# Patient Record
Sex: Female | Born: 1970 | Race: White | Hispanic: No | Marital: Married | Smoking: Never smoker
Health system: Southern US, Community
[De-identification: ages and names within clinical notes are randomized; demographics above are authoritative.]

## PROBLEM LIST (undated history)

## (undated) DIAGNOSIS — Z973 Presence of spectacles and contact lenses: Secondary | ICD-10-CM

## (undated) DIAGNOSIS — K219 Gastro-esophageal reflux disease without esophagitis: Secondary | ICD-10-CM

## (undated) DIAGNOSIS — T753XXA Motion sickness, initial encounter: Secondary | ICD-10-CM

## (undated) HISTORY — PX: FOOT SURGERY: SHX648

## (undated) HISTORY — DX: Gastro-esophageal reflux disease without esophagitis: K21.9

## (undated) HISTORY — PX: ECTOPIC PREGNANCY SURGERY: SHX613

## (undated) HISTORY — PX: DILATION AND CURETTAGE OF UTERUS: SHX78

---

## 2006-02-14 ENCOUNTER — Ambulatory Visit: Payer: Self-pay

## 2006-11-29 ENCOUNTER — Ambulatory Visit: Payer: Self-pay | Admitting: Obstetrics and Gynecology

## 2006-12-31 ENCOUNTER — Ambulatory Visit: Payer: Self-pay | Admitting: Obstetrics and Gynecology

## 2007-08-05 ENCOUNTER — Ambulatory Visit: Payer: Self-pay | Admitting: Obstetrics and Gynecology

## 2011-11-26 ENCOUNTER — Ambulatory Visit: Payer: Self-pay | Admitting: Obstetrics and Gynecology

## 2012-07-09 ENCOUNTER — Ambulatory Visit: Payer: Self-pay | Admitting: Internal Medicine

## 2012-11-26 ENCOUNTER — Ambulatory Visit: Payer: Self-pay | Admitting: Obstetrics and Gynecology

## 2014-07-09 ENCOUNTER — Ambulatory Visit: Payer: Self-pay | Admitting: Family Medicine

## 2015-09-21 ENCOUNTER — Other Ambulatory Visit: Payer: Self-pay | Admitting: Family Medicine

## 2015-10-18 ENCOUNTER — Telehealth: Payer: Self-pay

## 2015-10-18 DIAGNOSIS — N63 Unspecified lump in unspecified breast: Secondary | ICD-10-CM

## 2015-10-18 DIAGNOSIS — Z1239 Encounter for other screening for malignant neoplasm of breast: Secondary | ICD-10-CM

## 2015-10-18 NOTE — Telephone Encounter (Signed)
Patient called and states that she needs her mammogram scheduled for screening, her last one was 07/09/14 and US done at that time she had cyst. [place] told patient to call us to get this order since it was abnormal last time. Please review and let pt know when this is done please-aa

## 2015-10-19 DIAGNOSIS — M79673 Pain in unspecified foot: Secondary | ICD-10-CM | POA: Insufficient documentation

## 2015-10-19 DIAGNOSIS — Z8709 Personal history of other diseases of the respiratory system: Secondary | ICD-10-CM | POA: Insufficient documentation

## 2015-10-19 DIAGNOSIS — L255 Unspecified contact dermatitis due to plants, except food: Secondary | ICD-10-CM | POA: Insufficient documentation

## 2015-10-19 DIAGNOSIS — Z8719 Personal history of other diseases of the digestive system: Secondary | ICD-10-CM | POA: Insufficient documentation

## 2015-10-19 DIAGNOSIS — N63 Unspecified lump in unspecified breast: Secondary | ICD-10-CM | POA: Insufficient documentation

## 2015-10-19 NOTE — Telephone Encounter (Signed)
Would think diagnostic. Thanks.

## 2015-10-19 NOTE — Telephone Encounter (Signed)
Dr. Elease HashimotoMaloney should I order a screening or diagnostic mammogram? Please advise. sd

## 2015-10-19 NOTE — Telephone Encounter (Signed)
Ok to order and notify patient. Thanks.

## 2015-10-20 ENCOUNTER — Telehealth: Payer: Self-pay | Admitting: Family Medicine

## 2015-10-20 NOTE — Telephone Encounter (Signed)
Apt 11/01/2015 2pm; pt advised.   Thanks,   -Vernona RiegerLaura

## 2015-10-20 NOTE — Telephone Encounter (Signed)
Pt stating she would like for Dr. Elease HashimotoMaloney to write her an order for her mammogram. CB# [phone]

## 2015-11-01 ENCOUNTER — Ambulatory Visit
Admission: RE | Admit: 2015-11-01 | Discharge: 2015-11-01 | Disposition: A | Discharge status: Home / Self Care | Payer: PRIVATE HEALTH INSURANCE | Source: Ambulatory Visit | Attending: Family Medicine | Admitting: Family Medicine

## 2015-11-01 DIAGNOSIS — N63 Unspecified lump in unspecified breast: Secondary | ICD-10-CM

## 2015-11-01 DIAGNOSIS — Z1239 Encounter for other screening for malignant neoplasm of breast: Secondary | ICD-10-CM

## 2015-11-01 DIAGNOSIS — Z1231 Encounter for screening mammogram for malignant neoplasm of breast: Secondary | ICD-10-CM | POA: Insufficient documentation

## 2016-02-17 ENCOUNTER — Ambulatory Visit (INDEPENDENT_AMBULATORY_CARE_PROVIDER_SITE_OTHER): Payer: PRIVATE HEALTH INSURANCE | Admitting: Physician Assistant

## 2016-02-17 ENCOUNTER — Encounter: Payer: Self-pay | Admitting: Physician Assistant

## 2016-02-17 ENCOUNTER — Telehealth: Payer: Self-pay | Admitting: Physician Assistant

## 2016-02-17 VITALS — BP 140/70 | HR 81 | Temp 98.2°F | Resp 16 | Wt 169.2 lb

## 2016-02-17 DIAGNOSIS — N63 Unspecified lump in unspecified breast: Secondary | ICD-10-CM

## 2016-02-17 NOTE — Patient Instructions (Signed)
Breast Cyst A breast cyst is a sac in the breast that is filled with fluid. Breast cysts are common in women. Women can have one or many cysts. When the breasts contain many cysts, it is usually due to a noncancerous (benign) condition called fibrocystic change. These lumps form under the influence of female hormones (estrogen and progesterone). The lumps are most often located in the upper, outer portion of the breast. They are often more swollen, painful, and tender before your period starts. They usually disappear after menopause, unless you are on hormone therapy.  There are several types of cysts:  Macrocyst. This is a cyst that is about 2 in. (5.1 cm) in diameter.   Microcyst. This is a tiny cyst that you cannot feel but can be seen with a mammogram or an ultrasound.   Galactocele. This is a cyst containing milk that may develop if you suddenly stop breastfeeding.   Sebaceous cyst of the skin. This type of cyst is not in the breast tissue itself. Breast cysts do not increase your risk of breast cancer. However, they must be monitored closely because they can be cancerous.  CAUSES  It is not known exactly what causes a breast cyst to form. Possible causes include:  An overgrowth of milk glands and connective tissue in the breast can block the milk glands, causing them to fill with fluid.   Scar tissue in the breast from previous surgery may block the glands, causing a cyst.  RISK FACTORS Estrogen may influence the development of a breast cyst.  SIGNS AND SYMPTOMS   Feeling a smooth, round, soft lump (like a grape) in the breast that is easily moveable.   Breast discomfort or pain.  Increase in size of the lump before your menstrual period and decrease in its size after your menstrual period.  DIAGNOSIS  A cyst can be felt during a physical exam by your health care provider. A breast X-ray exam (mammogram) and ultrasonography will be done to confirm the diagnosis. Fluid may  be removed from the cyst with a needle (fine needle aspiration) to make sure the cyst is not cancerous.  TREATMENT  Treatment may not be necessary. Your health care provider may monitor the cyst to see if it goes away on its own. If treatment is needed, it may include:  Hormone treatment.   Needle aspiration. There is a chance of the cyst coming back after aspiration.   Surgery to remove the whole cyst.  HOME CARE INSTRUCTIONS   Keep all follow-up appointments with your health care provider.  See your health care provider regularly:  Get a yearly exam by your health care provider.  Have a clinical breast exam by a health care provider every 1-3 years if you are 20-40 years of age. After age 40 years, you should have the exam every year.   Get mammogram tests as directed by your health care provider.   Understand the normal appearance and feel of your breasts and perform breast self-exams.   Only take over-the-counter or prescription medicines as directed by your health care provider.   Wear a supportive bra, especially when exercising.   Avoid caffeine.   Reduce your salt intake, especially before your menstrual period. Too much salt can cause fluid retention, breast swelling, and discomfort.  SEEK MEDICAL CARE IF:   You feel, or think you feel, a lump in your breast.   You notice that both breasts look or feel different than usual.   Your   breast is still causing pain after your menstrual period is over.   You need medicine for breast pain and swelling that occurs with your menstrual period.  SEEK IMMEDIATE MEDICAL CARE IF:   You have severe pain, tenderness, redness, or warmth in your breast.   You have nipple discharge or bleeding.   Your breast lump becomes hard and painful.   You find new lumps or bumps that were not there before.   You feel lumps in your armpit (axilla).   You notice dimpling or wrinkling of the breast or nipple.   You  have a fever.  MAKE SURE YOU:  Understand these instructions.  Will watch your condition.  Will get help right away if you are not doing well or get worse.   This information is not intended to replace advice given to you by your health care provider. Make sure you discuss any questions you have with your health care provider.   Document Released: 12/10/2005 Document Revised: 08/12/2013 Document Reviewed: 07/09/2013 Elsevier Interactive Patient Education 2016 Elsevier Inc.  

## 2016-02-17 NOTE — Telephone Encounter (Signed)
Per [facility] pt is not due for mammogram until Nov.Order needs to be for diagnostic uni mammogram and ultrasound of the breast with the mass and should include clock position

## 2016-02-17 NOTE — Progress Notes (Signed)
       Patient: [name] [name] Female    DOB: 1971/09/11   45 y.o.   MRN: [id number] Visit Date: 02/17/2016  Today's Provider: Margaretann Loveless, PA-C   Chief Complaint  Patient presents with  . Lump on breast   Subjective:    HPI  [name] [name] ia here today with c/o of lump on her right breast. She noticed this past Saturday and it feels sore. She has come in the past for the same lump and was told it was cyst, but this time it feels different and is sore.  All movement of right arm causes pain in the area. No redness, no nipple drainage, no fevers, chills, nausea or vomiting.     Allergies  Allergen Reactions  . Sulfa Antibiotics Rash   Previous Medications   CYANOCOBALAMIN (VITAMIN B 12 PO)    Take by mouth.   IUD'S IU    by Intrauterine route.   MULTIPLE VITAMIN PO    Take by mouth.    Review of Systems  Constitutional: Negative.   HENT: Negative.   Eyes: Negative.   Respiratory: Negative.   Cardiovascular: Negative.   Gastrointestinal: Negative.   Neurological: Negative.     Social History  Substance Use Topics  . Smoking status: Never Smoker   . Smokeless tobacco: Not on file  . Alcohol Use: No   Objective:   BP 140/70 mmHg  Pulse 81  Temp(Src) 98.2 F (36.8 C) (Oral)  Resp 16  Wt 169 lb 3.2 oz (76.749 kg)  LMP 12/21/2015  Physical Exam  Constitutional: She appears well-developed and well-nourished. No distress.  Neck: Normal range of motion. Neck supple. No tracheal deviation present.  Cardiovascular: Normal rate, regular rhythm and normal heart sounds.  Exam reveals no gallop and no friction rub.   No murmur heard. Pulmonary/Chest: Effort normal and breath sounds normal. No respiratory distress. She has no wheezes. She has no rales. Right breast exhibits mass and tenderness. Right breast exhibits no inverted nipple, no nipple discharge and no skin change. Left breast exhibits no inverted nipple, no mass, no nipple discharge, no skin change and no  tenderness. Breasts are symmetrical.    Lymphadenopathy:    She has no cervical adenopathy.  Skin: She is not diaphoretic.  Vitals reviewed.       Assessment & Plan:     1. Breast lump Will obtain imaging as below. I will f/u pending these results. She does have history of cyst bilaterally that have started developing since she had her IUD placed. This one feels different to the patient but is in the same area as previously documented cyst.  - US BREAST COMPLETE UNI RIGHT INC AXILLA; Future - MM Digital Diagnostic Unilat R; Future '      Margaretann Loveless, PA-C  [facility]

## 2016-02-17 NOTE — Telephone Encounter (Signed)
Changes made to orders.

## 2016-02-20 ENCOUNTER — Ambulatory Visit
Admission: RE | Admit: 2016-02-20 | Discharge: 2016-02-20 | Disposition: A | Discharge status: Home / Self Care | Payer: PRIVATE HEALTH INSURANCE | Source: Ambulatory Visit | Attending: Physician Assistant | Admitting: Physician Assistant

## 2016-02-20 ENCOUNTER — Other Ambulatory Visit: Payer: Self-pay | Admitting: Physician Assistant

## 2016-02-20 DIAGNOSIS — N6001 Solitary cyst of right breast: Secondary | ICD-10-CM | POA: Diagnosis not present

## 2016-02-20 DIAGNOSIS — N63 Unspecified lump in unspecified breast: Secondary | ICD-10-CM

## 2016-02-20 NOTE — Telephone Encounter (Signed)
-----   Message from Margaretann Loveless, [place] sent at 02/20/2016  2:54 PM EST ----- Two adjacent cyst seen on mammogram and [place].  Recommend routine mammogram screening starting back in July at scheduled mammogram.

## 2016-02-20 NOTE — Telephone Encounter (Signed)
Left message to call back  

## 2016-02-22 NOTE — Telephone Encounter (Signed)
Patient advised as directed below.  Thanks,  -Mahin Guardia 

## 2016-02-27 ENCOUNTER — Telehealth: Payer: Self-pay | Admitting: Family Medicine

## 2016-02-27 NOTE — Telephone Encounter (Signed)
Is not cream that can help. Usually only a one time thing but is possible to get it again. Ok to return to work if not around people who are pregnant or immunocompromised. Otherwise can return when scab over.  Thanks.

## 2016-02-27 NOTE — Telephone Encounter (Signed)
Patient advised as below, and verbalizes understanding.

## 2016-02-27 NOTE — Telephone Encounter (Signed)
Patient reports that she was seen at [facility] on 02/26/16 due to rash on her right leg. Patient diagnosed with shingles. Patient reports that she was prescribed Acyclovir 800 mg 5 times a day for 7 days. Patient would like to know if there is an OTC cream that she can use and how long does need to stay out of work. Patient reports that she is under a lot of stress at work and reports that she is aware that it may have caused her to have shingles. Patient is wanting to know if this will be a problem for her from now on. sd

## 2016-02-27 NOTE — Telephone Encounter (Signed)
Pt stated she went to walk in clinic Sunday 02/26/16 and was told she has shingles. Pt would like a nurse to return her call. Pt wanted us to document that she has shingles in her chart and also has a few questions about shingle. Thanks TNP

## 2016-09-26 ENCOUNTER — Ambulatory Visit (INDEPENDENT_AMBULATORY_CARE_PROVIDER_SITE_OTHER): Payer: Managed Care, Other (non HMO) | Admitting: Physician Assistant

## 2016-09-26 ENCOUNTER — Encounter: Payer: Self-pay | Admitting: Physician Assistant

## 2016-09-26 VITALS — BP 122/76 | HR 58 | Temp 98.1°F | Resp 14 | Wt 171.2 lb

## 2016-09-26 DIAGNOSIS — K449 Diaphragmatic hernia without obstruction or gangrene: Secondary | ICD-10-CM

## 2016-09-26 DIAGNOSIS — K219 Gastro-esophageal reflux disease without esophagitis: Secondary | ICD-10-CM

## 2016-09-26 MED ORDER — PANTOPRAZOLE SODIUM 20 MG PO TBEC: 20.0000 mg | DELAYED_RELEASE_TABLET | Freq: Every day | ORAL | 1 refills | Status: DC

## 2016-09-26 NOTE — Patient Instructions (Signed)
Gastroesophageal Reflux Disease, Adult Normally, food travels down the esophagus and stays in the stomach to be digested. However, when a person has gastroesophageal reflux disease (GERD), food and stomach acid move back up into the esophagus. When this happens, the esophagus becomes sore and inflamed. Over time, GERD can create small holes (ulcers) in the lining of the esophagus.  CAUSES This condition is caused by a problem with the muscle between the esophagus and the stomach (lower esophageal sphincter, or LES). Normally, the LES muscle closes after food passes through the esophagus to the stomach. When the LES is weakened or abnormal, it does not close properly, and that allows food and stomach acid to go back up into the esophagus. The LES can be weakened by certain dietary substances, medicines, and medical conditions, including:  Tobacco use.  Pregnancy.  Having a hiatal hernia.  Heavy alcohol use.  Certain foods and beverages, such as coffee, chocolate, onions, and peppermint. RISK FACTORS This condition is more likely to develop in:  People who have an increased body weight.  People who have connective tissue disorders.  People who use NSAID medicines. SYMPTOMS Symptoms of this condition include:  Heartburn.  Difficult or painful swallowing.  The feeling of having a lump in the throat.  Abitter taste in the mouth.  Bad breath.  Having a large amount of saliva.  Having an upset or bloated stomach.  Belching.  Chest pain.  Shortness of breath or wheezing.  Ongoing (chronic) cough or a night-time cough.  Wearing away of tooth enamel.  Weight loss. Different conditions can cause chest pain. Make sure to see your health care provider if you experience chest pain. DIAGNOSIS Your health care provider will take a medical history and perform a physical exam. To determine if you have mild or severe GERD, your health care provider may also monitor how you respond  to treatment. You may also have other tests, including:  An endoscopy toexamine your stomach and esophagus with a small camera.  A test thatmeasures the acidity level in your esophagus.  A test thatmeasures how much pressure is on your esophagus.  A barium swallow or modified barium swallow to show the shape, size, and functioning of your esophagus. TREATMENT The goal of treatment is to help relieve your symptoms and to prevent complications. Treatment for this condition may vary depending on how severe your symptoms are. Your health care provider may recommend:  Changes to your diet.  Medicine.  Surgery. HOME CARE INSTRUCTIONS Diet  Follow a diet as recommended by your health care provider. This may involve avoiding foods and drinks such as:  Coffee and tea (with or without caffeine).  Drinks that containalcohol.  Energy drinks and sports drinks.  Carbonated drinks or sodas.  Chocolate and cocoa.  Peppermint and mint flavorings.  Garlic and onions.  Horseradish.  Spicy and acidic foods, including peppers, chili powder, curry powder, vinegar, hot sauces, and barbecue sauce.  Citrus fruit juices and citrus fruits, such as oranges, lemons, and limes.  Tomato-based foods, such as red sauce, chili, salsa, and pizza with red sauce.  Fried and fatty foods, such as donuts, french fries, potato chips, and high-fat dressings.  High-fat meats, such as hot dogs and fatty cuts of red and white meats, such as rib eye steak, sausage, ham, and bacon.  High-fat dairy items, such as whole milk, butter, and cream cheese.  Eat small, frequent meals instead of large meals.  Avoid drinking large amounts of liquid with your   meals.  Avoid eating meals during the 2-3 hours before bedtime.  Avoid lying down right after you eat.  Do not exercise right after you eat. General Instructions  Pay attention to any changes in your symptoms.  Take over-the-counter and prescription  medicines only as told by your health care provider. Do not take aspirin, ibuprofen, or other NSAIDs unless your health care provider told you to do so.  Do not use any tobacco products, including cigarettes, chewing tobacco, and e-cigarettes. If you need help quitting, ask your health care provider.  Wear loose-fitting clothing. Do not wear anything tight around your waist that causes pressure on your abdomen.  Raise (elevate) the head of your bed 6 inches (15cm).  Try to reduce your stress, such as with yoga or meditation. If you need help reducing stress, ask your health care provider.  If you are overweight, reduce your weight to an amount that is healthy for you. Ask your health care provider for guidance about a safe weight loss goal.  Keep all follow-up visits as told by your health care provider. This is important. SEEK MEDICAL CARE IF:  You have new symptoms.  You have unexplained weight loss.  You have difficulty swallowing, or it hurts to swallow.  You have wheezing or a persistent cough.  Your symptoms do not improve with treatment.  You have a hoarse voice. SEEK IMMEDIATE MEDICAL CARE IF:  You have pain in your arms, neck, jaw, teeth, or back.  You feel sweaty, dizzy, or light-headed.  You have chest pain or shortness of breath.  You vomit and your vomit looks like blood or coffee grounds.  You faint.  Your stool is bloody or black.  You cannot swallow, drink, or eat.   This information is not intended to replace advice given to you by your health care provider. Make sure you discuss any questions you have with your health care provider.   Document Released: 09/19/2005 Document Revised: 08/31/2015 Document Reviewed: 04/06/2015 Elsevier Interactive Patient Education 2016 Elsevier Inc.  Food Choices for Gastroesophageal Reflux Disease, Adult When you have gastroesophageal reflux disease (GERD), the foods you eat and your eating habits are very important.  Choosing the right foods can help ease the discomfort of GERD. WHAT GENERAL GUIDELINES DO I NEED TO FOLLOW?  Choose fruits, vegetables, whole grains, low-fat dairy products, and low-fat meat, fish, and poultry.  Limit fats such as oils, salad dressings, butter, nuts, and avocado.  Keep a food diary to identify foods that cause symptoms.  Avoid foods that cause reflux. These may be different for different people.  Eat frequent small meals instead of three large meals each day.  Eat your meals slowly, in a relaxed setting.  Limit fried foods.  Cook foods using methods other than frying.  Avoid drinking alcohol.  Avoid drinking large amounts of liquids with your meals.  Avoid bending over or lying down until 2-3 hours after eating. WHAT FOODS ARE NOT RECOMMENDED? The following are some foods and drinks that may worsen your symptoms: Vegetables Tomatoes. Tomato juice. Tomato and spaghetti sauce. Chili peppers. Onion and garlic. Horseradish. Fruits Oranges, grapefruit, and lemon (fruit and juice). Meats High-fat meats, fish, and poultry. This includes hot dogs, ribs, ham, sausage, salami, and bacon. Dairy Whole milk and chocolate milk. Sour cream. Cream. Butter. Ice cream. Cream cheese.  Beverages Coffee and tea, with or without caffeine. Carbonated beverages or energy drinks. Condiments Hot sauce. Barbecue sauce.  Sweets/Desserts Chocolate and cocoa. Donuts. Peppermint and   spearmint. Fats and Oils High-fat foods, including French fries and potato chips. Other Vinegar. Strong spices, such as black pepper, white pepper, red pepper, cayenne, curry powder, cloves, ginger, and chili powder. The items listed above may not be a complete list of foods and beverages to avoid. Contact your dietitian for more information.   This information is not intended to replace advice given to you by your health care provider. Make sure you discuss any questions you have with your health care  provider.   Document Released: 12/10/2005 Document Revised: 12/31/2014 Document Reviewed: 10/14/2013 Elsevier Interactive Patient Education 2016 Elsevier Inc.  Pantoprazole tablets What is this medicine? PANTOPRAZOLE (pan TOE pra zole) prevents the production of acid in the stomach. It is used to treat gastroesophageal reflux disease (GERD), inflammation of the esophagus, and Zollinger-Ellison syndrome. This medicine may be used for other purposes; ask your health care provider or pharmacist if you have questions. What should I tell my health care provider before I take this medicine? They need to know if you have any of these conditions: -liver disease -low levels of magnesium in the blood -an unusual or allergic reaction to omeprazole, lansoprazole, pantoprazole, rabeprazole, other medicines, foods, dyes, or preservatives -pregnant or trying to get pregnant -breast-feeding How should I use this medicine? Take this medicine by mouth. Swallow the tablets whole with a drink of water. Follow the directions on the prescription label. Do not crush, break, or chew. Take your medicine at regular intervals. Do not take your medicine more often than directed. Talk to your pediatrician regarding the use of this medicine in children. While this drug may be prescribed for children as young as 5 years for selected conditions, precautions do apply. Overdosage: If you think you have taken too much of this medicine contact a poison control center or emergency room at once. NOTE: This medicine is only for you. Do not share this medicine with others. What if I miss a dose? If you miss a dose, take it as soon as you can. If it is almost time for your next dose, take only that dose. Do not take double or extra doses. What may interact with this medicine? Do not take this medicine with any of the following medications: -atazanavir -nelfinavir This medicine may also interact with the following  medications: -ampicillin -delavirdine -erlotinib -iron salts -medicines for fungal infections like ketoconazole, itraconazole and voriconazole -methotrexate -mycophenolate mofetil -warfarin This list may not describe all possible interactions. Give your health care provider a list of all the medicines, herbs, non-prescription drugs, or dietary supplements you use. Also tell them if you smoke, drink alcohol, or use illegal drugs. Some items may interact with your medicine. What should I watch for while using this medicine? It can take several days before your stomach pain gets better. Check with your doctor or health care professional if your condition does not start to get better, or if it gets worse. You may need blood work done while you are taking this medicine. What side effects may I notice from receiving this medicine? Side effects that you should report to your doctor or health care professional as soon as possible: -allergic reactions like skin rash, itching or hives, swelling of the face, lips, or tongue -bone, muscle or joint pain -breathing problems -chest pain or chest tightness -dark yellow or brown urine -dizziness -fast, irregular heartbeat -feeling faint or lightheaded -fever or sore throat -muscle spasm -palpitations -redness, blistering, peeling or loosening of the skin, including inside the   mouth -seizures -tremors -unusual bleeding or bruising -unusually weak or tired -yellowing of the eyes or skin Side effects that usually do not require medical attention (Report these to your doctor or health care professional if they continue or are bothersome.): -constipation -diarrhea -dry mouth -headache -nausea This list may not describe all possible side effects. Call your doctor for medical advice about side effects. You may report side effects to FDA at 1-800-FDA-1088. Where should I keep my medicine? Keep out of the reach of children. Store at room temperature  between 15 and 30 degrees C (59 and 86 degrees F). Protect from light and moisture. Throw away any unused medicine after the expiration date. NOTE: This sheet is a summary. It may not cover all possible information. If you have questions about this medicine, talk to your doctor, pharmacist, or health care provider.    2016, Elsevier/Gold Standard. (2015-01-28 14:45:56)   

## 2016-09-26 NOTE — Progress Notes (Signed)
Patient: [name] [name] Female    DOB: [birth year]   45 y.o.   MRN: [id number] Visit Date: 09/27/2016  Today's Provider: Margaretann Loveless, PA-C   Chief Complaint  Patient presents with  . Gastroesophageal Reflux   Subjective:    Gastroesophageal Reflux  She complains of abdominal pain, chest pain (pressure), heartburn and nausea. choking sensation. The current episode started more than 1 year ago (only at night time). The problem occurs occasionally. The problem has been unchanged. The heartburn is located in the substernum. The heartburn is of severe intensity. The heartburn wakes her from sleep. The heartburn limits (sleeping) her activity. The heartburn doesn't change with position. The symptoms are aggravated by certain foods. Risk factors include caffeine use. She has tried an antacid for the symptoms. The treatment provided mild relief.  She has had an EGD performed was told that she did have a hiatal hernia. Nothing is ever been done for this nor has it been followed.    Previous Medications   CYANOCOBALAMIN (VITAMIN B 12 PO)    Take by mouth.   HOMEOPATHIC PRODUCTS (ALLERGY MEDICINE PO)    Take by mouth.   IUD'S IU    by Intrauterine route.   MISC NATURAL PRODUCTS (TART CHERRY ADVANCED PO)    Take by mouth.   MULTIPLE VITAMIN PO    Take by mouth.   PROBIOTIC PRODUCT (PROBIOTIC DAILY PO)    Take by mouth.    Review of Systems  Constitutional: Negative.   Respiratory: Negative.   Cardiovascular: Positive for chest pain (pressure).  Gastrointestinal: Positive for abdominal pain, heartburn and nausea. Negative for constipation, diarrhea and vomiting.       Heartburn/acid reflux  Neurological: Negative.     Social History  Substance Use Topics  . Smoking status: Never Smoker  . Smokeless tobacco: Not on file  . Alcohol use No   Objective:   BP 122/76 (BP Location: Right Arm, Patient Position: Sitting, Cuff Size: Normal)   Pulse (!) 58   Temp 98.1 F (36.7 C) (Oral)    Resp 14   Wt 171 lb 3.2 oz (77.7 kg)   Physical Exam  Constitutional: She is oriented to person, place, and time. She appears well-developed and well-nourished. No distress.  Cardiovascular: Normal rate, regular rhythm and normal heart sounds.  Exam reveals no gallop and no friction rub.   No murmur heard. Pulmonary/Chest: Effort normal and breath sounds normal. No respiratory distress. She has no wheezes. She has no rales.  Abdominal: Soft. Normal appearance and bowel sounds are normal. She exhibits no distension and no mass. There is no hepatosplenomegaly. There is no tenderness. There is no rebound, no guarding and no CVA tenderness.  Epigastric pressure  Neurological: She is alert and oriented to person, place, and time.  Skin: Skin is warm and dry. She is not diaphoretic.      Assessment & Plan:     1. Gastroesophageal reflux disease, esophagitis presence not specified Worsening symptoms. She has tried Nexium in the past and failed due to side effects, diarrhea. I will prescribe protonix as below. She is to do a trial of 2-4 weeks with this medication and call if there is no symptom improvement. If she does not respond to treatment Will refer her to GI for further evaluation of the hiatal hernia. - pantoprazole (PROTONIX) 20 MG tablet; Take 1 tablet (20 mg total) by mouth daily.  Dispense: 30 tablet; Refill: 1  2. Hiatal hernia Noted on  EGD many years ago. No follow-up since. Worsening acid reflux over the last year.   Follow up: Return if symptoms worsen or fail to improve.

## 2016-09-27 DIAGNOSIS — K449 Diaphragmatic hernia without obstruction or gangrene: Secondary | ICD-10-CM | POA: Insufficient documentation

## 2016-09-27 DIAGNOSIS — K219 Gastro-esophageal reflux disease without esophagitis: Secondary | ICD-10-CM | POA: Insufficient documentation

## 2016-10-30 ENCOUNTER — Encounter: Payer: Self-pay | Admitting: Physician Assistant

## 2016-10-30 DIAGNOSIS — K219 Gastro-esophageal reflux disease without esophagitis: Secondary | ICD-10-CM

## 2016-10-30 MED ORDER — PANTOPRAZOLE SODIUM 20 MG PO TBEC: 20.0000 mg | DELAYED_RELEASE_TABLET | Freq: Every day | ORAL | 5 refills | Status: DC

## 2016-11-27 ENCOUNTER — Other Ambulatory Visit: Payer: Self-pay | Admitting: Physician Assistant

## 2016-11-27 DIAGNOSIS — Z1231 Encounter for screening mammogram for malignant neoplasm of breast: Secondary | ICD-10-CM

## 2017-01-02 ENCOUNTER — Ambulatory Visit
Admission: RE | Admit: 2017-01-02 | Discharge: 2017-01-02 | Disposition: A | Discharge status: Home / Self Care | Payer: Managed Care, Other (non HMO) | Source: Ambulatory Visit | Attending: Physician Assistant | Admitting: Physician Assistant

## 2017-01-02 DIAGNOSIS — Z1231 Encounter for screening mammogram for malignant neoplasm of breast: Secondary | ICD-10-CM | POA: Insufficient documentation

## 2017-01-03 ENCOUNTER — Telehealth: Payer: Self-pay

## 2017-01-03 NOTE — Telephone Encounter (Signed)
-----   Message from Margaretann LovelessJennifer M Burnette, PA-C sent at 01/03/2017  8:50 AM EST ----- Normal mammogram. Repeat screening in one year.

## 2017-01-03 NOTE — Telephone Encounter (Signed)
Patient was advised of results.  Thanks,  -Jasai Sorg

## 2017-03-06 ENCOUNTER — Encounter: Payer: Self-pay | Admitting: Physician Assistant

## 2017-03-06 DIAGNOSIS — K219 Gastro-esophageal reflux disease without esophagitis: Secondary | ICD-10-CM

## 2017-03-06 MED ORDER — PANTOPRAZOLE SODIUM 20 MG PO TBEC: 20.0000 mg | DELAYED_RELEASE_TABLET | Freq: Every day | ORAL | 5 refills | Status: DC

## 2017-09-23 ENCOUNTER — Telehealth: Payer: Self-pay

## 2017-09-23 NOTE — Telephone Encounter (Signed)
All numbers in chart are wrong numbers. Letter sent out for patient to call and schedule appt for CPE. sd

## 2017-10-10 ENCOUNTER — Encounter: Payer: Self-pay | Admitting: Physician Assistant

## 2017-10-10 DIAGNOSIS — K219 Gastro-esophageal reflux disease without esophagitis: Secondary | ICD-10-CM

## 2017-10-10 MED ORDER — PANTOPRAZOLE SODIUM 20 MG PO TBEC: 20.0000 mg | DELAYED_RELEASE_TABLET | Freq: Every day | ORAL | 3 refills | Status: DC

## 2017-10-30 ENCOUNTER — Other Ambulatory Visit: Payer: Self-pay | Admitting: Physician Assistant

## 2017-10-30 DIAGNOSIS — K219 Gastro-esophageal reflux disease without esophagitis: Secondary | ICD-10-CM

## 2017-11-20 ENCOUNTER — Encounter: Payer: Self-pay | Admitting: Physician Assistant

## 2017-11-20 DIAGNOSIS — K219 Gastro-esophageal reflux disease without esophagitis: Secondary | ICD-10-CM

## 2017-11-20 MED ORDER — PANTOPRAZOLE SODIUM 20 MG PO TBEC: 20.0000 mg | DELAYED_RELEASE_TABLET | Freq: Every day | ORAL | 0 refills | Status: DC

## 2017-11-28 ENCOUNTER — Telehealth: Payer: Self-pay

## 2017-11-28 NOTE — Telephone Encounter (Signed)
Pt returned call. Insurance info as followsJanne [name]:  [employer]  ID# [id number]  Provider line# [phone]  Pt is also going to fax our office a copy of her insurance card for our records. Thanks TNP

## 2017-11-28 NOTE — Telephone Encounter (Signed)
Called patient and asked for a new insurance card in order to get PA for the Pantoprazole medication.  Patient is going to call me back with the information.  Thanks,  -Joseline

## 2018-01-01 ENCOUNTER — Encounter: Payer: Self-pay | Admitting: Physician Assistant

## 2018-01-01 DIAGNOSIS — K219 Gastro-esophageal reflux disease without esophagitis: Secondary | ICD-10-CM

## 2018-01-02 ENCOUNTER — Encounter: Payer: Self-pay | Admitting: Physician Assistant

## 2018-01-02 ENCOUNTER — Other Ambulatory Visit: Payer: Self-pay | Admitting: Physician Assistant

## 2018-01-02 DIAGNOSIS — Z1239 Encounter for other screening for malignant neoplasm of breast: Secondary | ICD-10-CM

## 2018-01-02 NOTE — Addendum Note (Signed)
Addended by: Margaretann LovelessBURNETTE, Goldie M on: 01/02/2018 02:45 PM   Modules accepted: Orders

## 2018-01-29 ENCOUNTER — Encounter (HOSPITAL_COMMUNITY): Payer: Self-pay

## 2018-01-29 ENCOUNTER — Ambulatory Visit
Admission: RE | Admit: 2018-01-29 | Discharge: 2018-01-29 | Disposition: A | Discharge status: Home / Self Care | Payer: PRIVATE HEALTH INSURANCE | Source: Ambulatory Visit | Attending: Physician Assistant | Admitting: Physician Assistant

## 2018-01-29 DIAGNOSIS — Z1231 Encounter for screening mammogram for malignant neoplasm of breast: Secondary | ICD-10-CM | POA: Insufficient documentation

## 2018-01-29 DIAGNOSIS — Z1239 Encounter for other screening for malignant neoplasm of breast: Secondary | ICD-10-CM

## 2018-01-30 ENCOUNTER — Telehealth: Payer: Self-pay

## 2018-01-30 NOTE — Telephone Encounter (Signed)
-----   Message from Margaretann LovelessJennifer M Burnette, [place]PA-C sent at 01/30/2018 11:03 AM EST ----- Normal mammogram. Repeat screening in one year.

## 2018-01-30 NOTE — Telephone Encounter (Signed)
Patient advised as directed below.  Thanks,  -Joseline 

## 2018-02-12 ENCOUNTER — Other Ambulatory Visit: Payer: Self-pay

## 2018-02-12 ENCOUNTER — Ambulatory Visit (INDEPENDENT_AMBULATORY_CARE_PROVIDER_SITE_OTHER): Payer: PRIVATE HEALTH INSURANCE | Admitting: Gastroenterology

## 2018-02-12 ENCOUNTER — Encounter: Payer: Self-pay | Admitting: Gastroenterology

## 2018-02-12 VITALS — BP 140/67 | HR 83 | Ht 62.0 in | Wt 180.0 lb

## 2018-02-12 DIAGNOSIS — K219 Gastro-esophageal reflux disease without esophagitis: Secondary | ICD-10-CM

## 2018-02-12 DIAGNOSIS — K2100 Gastro-esophageal reflux disease with esophagitis, without bleeding: Secondary | ICD-10-CM

## 2018-02-13 NOTE — Progress Notes (Signed)
Gastroenterology Consultation  Referring Provider:     Suella GroveBurnette, Mekaylah M, P* Primary Care Physician:  Margaretann LovelessBurnette, Shannell M, PA-C Primary Gastroenterologist:  Dr. Servando SnareWohl     Reason for Consultation:     Heartburn        HPI:   [name] [name] is a 47 y.o. y/o female referred for consultation & management of Heartburn by Dr. Rosezetta SchlatterBurnette, Alessandra BevelsJennifer M, PA-C.  This patient comes in today with a history of heartburn for many years.  The patient is presently taking Protonix for her heartburn. The patient does report some acid breakthrough when she does not take her medication.  The patient denies any nausea vomiting fevers or chills.  She also denies any dysphagia.  The patient has never had an upper endoscopy and is concerned because she had her that chronic reflux can cause her to have  Esophageal cancer. The patient reports her reflux symptoms to be chest pressure with some choking sensation.  She did start taking her Protonix at 4:00 in the evening because most of her symptoms were at night but did not see much change. The patient reports that she did have an EGD in the past without a finding of a hiatal hernia and not much was done in way of relieving her symptoms.  Past Medical History:  Diagnosis Date  . GERD (gastroesophageal reflux disease)     Past Surgical History:  Procedure Laterality Date  . DILATION AND CURETTAGE OF UTERUS    . ECTOPIC PREGNANCY SURGERY      Prior to Admission medications   Medication Sig Start Date End Date Taking? Authorizing Provider  Cyanocobalamin (VITAMIN B 12 PO) Take by mouth.   Yes [provider]  Homeopathic Products (ALLERGY MEDICINE PO) Take by mouth.   Yes [provider]  IUD'S IU by Intrauterine route.   Yes [provider]  Misc Natural Products (TART CHERRY ADVANCED PO) Take by mouth.   Yes [provider]  MULTIPLE VITAMIN PO Take by mouth.   Yes [provider]  pantoprazole (PROTONIX) 20 MG  tablet Take 1-2 tablets (20-40 mg total) by mouth daily. 11/20/17  Yes Margaretann LovelessBurnette, Tyisha M, PA-C  Probiotic Product (PROBIOTIC DAILY PO) Take by mouth.   Yes [provider]    Family History  Problem Relation Age of Onset  . Hypertension Mother   . Atrial fibrillation Mother   . Ovarian cancer Maternal Aunt   . Breast cancer Neg Hx      Social History   Tobacco Use  . Smoking status: Never Smoker  . Smokeless tobacco: Never Used  Substance Use Topics  . Alcohol use: No  . Drug use: No    Allergies as of 02/12/2018 - Review Complete 02/12/2018  Allergen Reaction Noted  . Sulfa antibiotics Rash 10/19/2015    Review of Systems:    All systems reviewed and negative except where noted in HPI.   Physical Exam:  BP 140/67   Pulse 83   Ht 5\' 2"  (1.575 m)   Wt 180 lb (81.6 kg)   BMI 32.92 kg/m  No LMP recorded. Patient is not currently having periods (Reason: IUD). Psych:  Alert and cooperative. Normal mood and affect. General:   Alert,  Well-developed, well-nourished, pleasant and cooperative in NAD Head:  Normocephalic and atraumatic. Eyes:  Sclera clear, no icterus.   Conjunctiva pink. Ears:  Normal auditory acuity. Nose:  No deformity, discharge, or lesions. Mouth:  No deformity or lesions,oropharynx pink &  moist. Neck:  Supple; no masses or thyromegaly. Lungs:  Respirations even and unlabored.  Clear throughout to auscultation.   No wheezes, crackles, or rhonchi. No acute distress. Heart:  Regular rate and rhythm; no murmurs, clicks, rubs, or gallops. Abdomen:  Normal bowel sounds.  No bruits.  Soft, non-tender and non-distended without masses, hepatosplenomegaly or hernias noted.  No guarding or rebound tenderness.  Negative Carnett sign.   Rectal:  Deferred.  Msk:  Symmetrical without gross deformities.  Good, equal movement & strength bilaterally. Pulses:  Normal pulses noted. Extremities:  No clubbing or edema.  No cyanosis. Neurologic:  Alert and  oriented x3;  grossly normal neurologically. Skin:  Intact without significant lesions or rashes.  No jaundice. Lymph Nodes:  No significant cervical adenopathy. Psych:  Alert and cooperative. Normal mood and affect.  Imaging Studies: Mm Screening Breast Tomo Bilateral  Result Date: 01/30/2018 CLINICAL DATA:  Screening. EXAM: DIGITAL SCREENING BILATERAL MAMMOGRAM WITH TOMO AND CAD COMPARISON:  Previous exam(s). ACR Breast Density Category c: The breast tissue is heterogeneously dense, which may obscure small masses. FINDINGS: There are no findings suspicious for malignancy. Images were processed with CAD. IMPRESSION: No mammographic evidence of malignancy. A result letter of this screening mammogram will be mailed directly to the patient. RECOMMENDATION: Screening mammogram in one year. (Code:SM-B-01Y) BI-RADS CATEGORY  1: Negative. Electronically Signed   By: Britta Mccreedy M.D.   On: 01/30/2018 10:54    Assessment and Plan:   [name] [name] is a 47 y.o. y/o female with a history of long-standing heartburn and the concern about Barrett's esophagus.  The patient will be set up for an EGD. Since the patient's symptoms are more prevalent at night the patient has been told to take her Protonix 1-2 hours before she goes to sleep sitting that she'll have the maximum effect of her medication while she is sleeping.  The patient has also been told that if she does not have relief from changing the timing of her medication she may need to switch to a different medication. I have discussed risks & benefits which include, but are not limited to, bleeding, infection, perforation & drug reaction.  The patient agrees with this plan & written consent will be obtained.     Midge Minium, MD. Clementeen Graham   Note: This dictation was prepared with Dragon dictation along with smaller phrase technology. Any transcriptional errors that result from this process are unintentional.

## 2018-02-17 ENCOUNTER — Other Ambulatory Visit: Payer: Self-pay

## 2018-02-17 ENCOUNTER — Encounter: Payer: Self-pay | Admitting: *Deleted

## 2018-02-21 ENCOUNTER — Ambulatory Visit
Admission: RE | Admit: 2018-02-21 | Discharge: 2018-02-21 | Disposition: A | Discharge status: Home / Self Care | Payer: PRIVATE HEALTH INSURANCE | Source: Ambulatory Visit | Attending: Gastroenterology | Admitting: Gastroenterology

## 2018-02-21 ENCOUNTER — Ambulatory Visit: Payer: PRIVATE HEALTH INSURANCE | Admitting: Anesthesiology

## 2018-02-21 ENCOUNTER — Encounter
Admission: RE | Disposition: A | Discharge status: Home / Self Care | Payer: Self-pay | Source: Ambulatory Visit | Attending: Gastroenterology

## 2018-02-21 DIAGNOSIS — R12 Heartburn: Secondary | ICD-10-CM

## 2018-02-21 DIAGNOSIS — K210 Gastro-esophageal reflux disease with esophagitis: Secondary | ICD-10-CM | POA: Insufficient documentation

## 2018-02-21 DIAGNOSIS — K317 Polyp of stomach and duodenum: Secondary | ICD-10-CM

## 2018-02-21 DIAGNOSIS — K449 Diaphragmatic hernia without obstruction or gangrene: Secondary | ICD-10-CM | POA: Diagnosis not present

## 2018-02-21 DIAGNOSIS — Z79899 Other long term (current) drug therapy: Secondary | ICD-10-CM | POA: Diagnosis not present

## 2018-02-21 HISTORY — DX: Presence of spectacles and contact lenses: Z97.3

## 2018-02-21 HISTORY — DX: Motion sickness, initial encounter: T75.3XXA

## 2018-02-21 HISTORY — PX: POLYPECTOMY: SHX5525

## 2018-02-21 HISTORY — PX: ESOPHAGOGASTRODUODENOSCOPY (EGD) WITH PROPOFOL: SHX5813

## 2018-02-21 SURGERY — ESOPHAGOGASTRODUODENOSCOPY (EGD) WITH PROPOFOL
Anesthesia: General | Site: Throat | Wound class: Clean Contaminated

## 2018-02-21 MED ORDER — GLYCOPYRROLATE 0.2 MG/ML IJ SOLN
INTRAMUSCULAR | Status: DC | PRN
  Administered 2018-02-21: 0.1 mg via INTRAVENOUS

## 2018-02-21 MED ORDER — LIDOCAINE HCL (CARDIAC) 20 MG/ML IV SOLN
INTRAVENOUS | Status: DC | PRN
  Administered 2018-02-21: 30 mg via INTRAVENOUS

## 2018-02-21 MED ORDER — MEPERIDINE HCL 25 MG/ML IJ SOLN: 6.2500 mg | INTRAMUSCULAR | Status: DC | PRN

## 2018-02-21 MED ORDER — LACTATED RINGERS IV SOLN
INTRAVENOUS | Status: DC
  Administered 2018-02-21: 11:00:00 via INTRAVENOUS

## 2018-02-21 MED ORDER — OXYCODONE HCL 5 MG PO TABS: 5.0000 mg | ORAL_TABLET | Freq: Once | ORAL | Status: DC | PRN

## 2018-02-21 MED ORDER — OXYCODONE HCL 5 MG/5ML PO SOLN: 5.0000 mg | Freq: Once | ORAL | Status: DC | PRN

## 2018-02-21 MED ORDER — FENTANYL CITRATE (PF) 100 MCG/2ML IJ SOLN
25.0000 ug | INTRAMUSCULAR | Status: DC | PRN
Start: 2018-02-21 — End: 2018-02-21

## 2018-02-21 MED ORDER — PROMETHAZINE HCL 25 MG/ML IJ SOLN: 6.2500 mg | INTRAMUSCULAR | Status: DC | PRN

## 2018-02-21 MED ORDER — STERILE WATER FOR IRRIGATION IR SOLN
Status: DC | PRN
  Administered 2018-02-21: .5 mL

## 2018-02-21 MED ORDER — PROPOFOL 10 MG/ML IV BOLUS
INTRAVENOUS | Status: DC | PRN
  Administered 2018-02-21 (×3): 20 mg via INTRAVENOUS
  Administered 2018-02-21 (×2): 50 mg via INTRAVENOUS
  Administered 2018-02-21: 80 mg via INTRAVENOUS

## 2018-02-21 MED ORDER — FENTANYL CITRATE (PF) 100 MCG/2ML IJ SOLN: 25.0000 ug | INTRAMUSCULAR | Status: DC | PRN

## 2018-02-21 SURGICAL SUPPLY — 33 items
BALLN DILATOR 10-12 8 (BALLOONS)
BALLN DILATOR 12-15 8 (BALLOONS)
BALLN DILATOR 15-18 8 (BALLOONS)
BALLN DILATOR CRE 0-12 8 (BALLOONS)
BALLN DILATOR ESOPH 8 10 CRE (MISCELLANEOUS) IMPLANT
BALLOON DILATOR 12-15 8 (BALLOONS) IMPLANT
BALLOON DILATOR 15-18 8 (BALLOONS) IMPLANT
BALLOON DILATOR CRE 0-12 8 (BALLOONS) IMPLANT
BLOCK BITE 60FR ADLT L/F GRN (MISCELLANEOUS) ×4 IMPLANT
CANISTER SUCT 1200ML W/VALVE (MISCELLANEOUS) ×4 IMPLANT
CLIP HMST 235XBRD CATH ROT (MISCELLANEOUS) IMPLANT
CLIP RESOLUTION 360 11X235 (MISCELLANEOUS)
ELECT REM PT RETURN 9FT ADLT (ELECTROSURGICAL)
ELECTRODE REM PT RTRN 9FT ADLT (ELECTROSURGICAL) IMPLANT
FCP ESCP3.2XJMB 240X2.8X (MISCELLANEOUS)
FORCEPS BIOP RAD 4 LRG CAP 4 (CUTTING FORCEPS) IMPLANT
FORCEPS BIOP RJ4 240 W/NDL (MISCELLANEOUS)
FORCEPS ESCP3.2XJMB 240X2.8X (MISCELLANEOUS) IMPLANT
GOWN CVR UNV OPN BCK APRN NK (MISCELLANEOUS) ×4 IMPLANT
GOWN ISOL THUMB LOOP REG UNIV (MISCELLANEOUS) ×4
INJECTOR VARIJECT VIN23 (MISCELLANEOUS) IMPLANT
KIT DEFENDO VALVE AND CONN (KITS) IMPLANT
KIT ENDO PROCEDURE OLY (KITS) ×4 IMPLANT
MARKER SPOT ENDO TATTOO 5ML (MISCELLANEOUS) IMPLANT
RETRIEVER NET PLAT FOOD (MISCELLANEOUS) IMPLANT
SNARE SHORT THROW 13M SML OVAL (MISCELLANEOUS) IMPLANT
SNARE SHORT THROW 30M LRG OVAL (MISCELLANEOUS) IMPLANT
SPOT EX ENDOSCOPIC TATTOO (MISCELLANEOUS)
SYR INFLATION 60ML (SYRINGE) IMPLANT
TRAP ETRAP POLY (MISCELLANEOUS) IMPLANT
VARIJECT INJECTOR VIN23 (MISCELLANEOUS)
WATER STERILE IRR 250ML POUR (IV SOLUTION) ×4 IMPLANT
WIRE CRE 18-20MM 8CM F G (MISCELLANEOUS) IMPLANT

## 2018-02-21 NOTE — Op Note (Signed)
[facility] Gastroenterology Patient Name: [name] [name] Procedure Date: 02/21/2018 10:40 AM MRN: [id number] Account #: [id number] Date of Birth: 07-27-1971 Admit Type: Outpatient Age: 47 Room: [facility] OR ROOM 01 Gender: Female Note Status: Finalized Procedure:            Upper GI endoscopy Indications:          Heartburn Providers:            Midge Minium MD, MD Referring MD:         Margaretann Loveless (Referring MD) Medicines:            Propofol per Anesthesia Complications:        No immediate complications. Procedure:            Pre-Anesthesia Assessment:                       - Prior to the procedure, a History and Physical was                        performed, and patient medications and allergies were                        reviewed. The patient's tolerance of previous                        anesthesia was also reviewed. The risks and benefits of                        the procedure and the sedation options and risks were                        discussed with the patient. All questions were                        answered, and informed consent was obtained. Prior                        Anticoagulants: The patient has taken no previous                        anticoagulant or antiplatelet agents. ASA Grade                        Assessment: II - A patient with mild systemic disease.                        After reviewing the risks and benefits, the patient was                        deemed in satisfactory condition to undergo the                        procedure.                       After obtaining informed consent, the endoscope was                        passed under direct vision. Throughout the procedure,  the patient's blood pressure, pulse, and oxygen                        saturations were monitored continuously. The Olympus                        [id number]-HQ190 Endoscope (S#. [id number]) was introduced                        through  the mouth, and advanced to the second part of                        duodenum. The upper GI endoscopy was accomplished                        without difficulty. The patient tolerated the procedure                        well. Findings:      A small hiatal hernia was present.      Multiple semi-sessile polyps with no bleeding and no stigmata of recent       bleeding were found in the cardia and in the gastric fundus. Biopsies       were taken with a cold forceps for histology.      The examined duodenum was normal. Impression:           - Small hiatal hernia.                       - Multiple gastric polyps. Biopsied.                       - Normal examined duodenum. Recommendation:       - Discharge patient to home.                       - Resume previous diet.                       - Continue present medications.                       - Await pathology results. Procedure Code(s):    --- Professional ---                       [id number], Esophagogastroduodenoscopy, flexible, transoral;                        with biopsy, single or multiple Diagnosis Code(s):    --- Professional ---                       R12, Heartburn                       K31.7, Polyp of stomach and duodenum CPT copyright 2016 American Medical Association. All rights reserved. The codes documented in this report are preliminary and upon coder review may  be revised to meet current compliance requirements. Midge Miniumarren Lorenia Hoston MD, MD 02/21/2018 11:00:47 AM This report has been signed electronically. Number of Addenda: 0 Note Initiated On: 02/21/2018 10:40 AM      [facility]

## 2018-02-21 NOTE — Anesthesia Postprocedure Evaluation (Signed)
Anesthesia Post Note  Patient: Darrold JunkerJennifer B Stenerson  Procedure(s) Performed: ESOPHAGOGASTRODUODENOSCOPY (EGD) WITH PROPOFOL (N/A Throat) POLYPECTOMY (Throat)  Patient location during evaluation: PACU Anesthesia Type: General Level of consciousness: awake and alert Pain management: pain level controlled Vital Signs Assessment: post-procedure vital signs reviewed and stable Respiratory status: spontaneous breathing, nonlabored ventilation, respiratory function stable and patient connected to nasal cannula oxygen Cardiovascular status: blood pressure returned to baseline and stable Postop Assessment: no apparent nausea or vomiting Anesthetic complications: no    SCOURAS, NICOLE ELAINE

## 2018-02-21 NOTE — Discharge Instructions (Signed)
General Anesthesia, Adult, Care After °These instructions provide you with information about caring for yourself after your procedure. Your health care provider may also give you more specific instructions. Your treatment has been planned according to current medical practices, but problems sometimes occur. Call your health care provider if you have any problems or questions after your procedure. °What can I expect after the procedure? °After the procedure, it is common to have: °· Vomiting. °· A sore throat. °· Mental slowness. ° °It is common to feel: °· Nauseous. °· Cold or shivery. °· Sleepy. °· Tired. °· Sore or achy, even in parts of your body where you did not have surgery. ° °Follow these instructions at home: °For at least 24 hours after the procedure: °· Do not: °? Participate in activities where you could fall or become injured. °? Drive. °? Use heavy machinery. °? Drink alcohol. °? Take sleeping pills or medicines that cause drowsiness. °? Make important decisions or sign legal documents. °? Take care of children on your own. °· Rest. °Eating and drinking °· If you vomit, drink water, juice, or soup when you can drink without vomiting. °· Drink enough fluid to keep your urine clear or pale yellow. °· Make sure you have little or no nausea before eating solid foods. °· Follow the diet recommended by your health care provider. °General instructions °· Have a responsible adult stay with you until you are awake and alert. °· Return to your normal activities as told by your health care provider. Ask your health care provider what activities are safe for you. °· Take over-the-counter and prescription medicines only as told by your health care provider. °· If you smoke, do not smoke without supervision. °· Keep all follow-up visits as told by your health care provider. This is important. °Contact a health care provider if: °· You continue to have nausea or vomiting at home, and medicines are not helpful. °· You  cannot drink fluids or start eating again. °· You cannot urinate after 8-12 hours. °· You develop a skin rash. °· You have fever. °· You have increasing redness at the site of your procedure. °Get help right away if: °· You have difficulty breathing. °· You have chest pain. °· You have unexpected bleeding. °· You feel that you are having a life-threatening or urgent problem. °This information is not intended to replace advice given to you by your health care provider. Make sure you discuss any questions you have with your health care provider. °Document Released: 03/18/2001 Document Revised: 05/14/2016 Document Reviewed: 11/24/2015 °Elsevier Interactive Patient Education © 2018 [employer]vier Inc. ° °

## 2018-02-21 NOTE — Anesthesia Preprocedure Evaluation (Signed)
Anesthesia Evaluation  Patient identified by MRN, date of birth, ID band Patient awake    Reviewed: Allergy & Precautions, H&P , NPO status , Patient's Chart, lab work & pertinent test results, reviewed documented beta blocker date and time   Airway Mallampati: II  TM Distance: >3 FB Neck ROM: full    Dental no notable dental hx.    Pulmonary neg pulmonary ROS,    Pulmonary exam normal breath sounds clear to auscultation       Cardiovascular Exercise Tolerance: Good negative cardio ROS   Rhythm:regular Rate:Normal     Neuro/Psych negative neurological ROS  negative psych ROS   GI/Hepatic Neg liver ROS, hiatal hernia, GERD  ,  Endo/Other  negative endocrine ROS  Renal/GU negative Renal ROS  negative genitourinary   Musculoskeletal   Abdominal   Peds  Hematology negative hematology ROS (+)   Anesthesia Other Findings   Reproductive/Obstetrics negative OB ROS                             Anesthesia Physical Anesthesia Plan  ASA: II  Anesthesia Plan: General   Post-op Pain Management:    Induction:   PONV Risk Score and Plan:   Airway Management Planned:   Additional Equipment:   Intra-op Plan:   Post-operative Plan:   Informed Consent: I have reviewed the patients History and Physical, chart, labs and discussed the procedure including the risks, benefits and alternatives for the proposed anesthesia with the patient or authorized representative who has indicated his/her understanding and acceptance.   Dental Advisory Given  Plan Discussed with: CRNA  Anesthesia Plan Comments:         Anesthesia Quick Evaluation

## 2018-02-21 NOTE — H&P (Signed)
Midge Minium, MD [facility] [address] [place], [place] [address] Phone:[phone] Fax : [phone]  Primary Care Physician:  Margaretann Loveless, PA-C Primary Gastroenterologist:  Dr. Servando Snare  Pre-Procedure History & Physical: HPI:  [name] [name] is a 47 y.o. female is here for an endoscopy.   Past Medical History:  Diagnosis Date  . GERD (gastroesophageal reflux disease)   . Motion sickness    roller coasters  . Wears contact lenses     Past Surgical History:  Procedure Laterality Date  . DILATION AND CURETTAGE OF UTERUS    . ECTOPIC PREGNANCY SURGERY      Prior to Admission medications   Medication Sig Start Date End Date Taking? Authorizing Provider  Cyanocobalamin (VITAMIN B 12 PO) Take by mouth.   Yes [provider]  Homeopathic Products (ALLERGY MEDICINE PO) Take by mouth.   Yes [provider]  IUD'S IU by Intrauterine route.   Yes [provider]  Misc Natural Products (TART CHERRY ADVANCED PO) Take by mouth.   Yes [provider]  MULTIPLE VITAMIN PO Take by mouth.   Yes [provider]  pantoprazole (PROTONIX) 20 MG tablet Take 1-2 tablets (20-40 mg total) by mouth daily. 11/20/17  Yes Margaretann Loveless, PA-C  Probiotic Product (PROBIOTIC DAILY PO) Take by mouth.   Yes [provider]    Allergies as of 02/12/2018 - Review Complete 02/12/2018  Allergen Reaction Noted  . Sulfa antibiotics Rash 10/19/2015    Family History  Problem Relation Age of Onset  . Hypertension Mother   . Atrial fibrillation Mother   . Ovarian cancer Maternal Aunt   . Breast cancer Neg Hx     Social History   Socioeconomic History  . Marital status: Married    Spouse name: Not on file  . Number of children: Not on file  . Years of education: Not on file  . Highest education level: Not on file  Social Needs  . Financial resource strain: Not on file  . Food insecurity - worry: Not on file  . Food  insecurity - inability: Not on file  . Transportation needs - medical: Not on file  . Transportation needs - non-medical: Not on file  Occupational History  . Not on file  Tobacco Use  . Smoking status: Never Smoker  . Smokeless tobacco: Never Used  Substance and Sexual Activity  . Alcohol use: No  . Drug use: No  . Sexual activity: Not on file  Other Topics Concern  . Not on file  Social History Narrative  . Not on file    Review of Systems: See HPI, otherwise negative ROS  Physical Exam: BP 125/69   Pulse 77   Temp 97.7 F (36.5 C) (Temporal)   Ht 5\' 2"  (1.575 m)   Wt 179 lb (81.2 kg)   SpO2 100%   BMI 32.74 kg/m  General:   Alert,  pleasant and cooperative in NAD Head:  Normocephalic and atraumatic. Neck:  Supple; no masses or thyromegaly. Lungs:  Clear throughout to auscultation.    Heart:  Regular rate and rhythm. Abdomen:  Soft, nontender and nondistended. Normal bowel sounds, without guarding, and without rebound.   Neurologic:  Alert and  oriented x4;  grossly normal neurologically.  Impression/Plan: [name] [name] is here for an endoscopy to be performed for GERD  Risks, benefits, limitations, and alternatives regarding  endoscopy have been reviewed with the patient.  Questions have been answered.  All parties agreeable.   Midge Miniumarren Deronda Christian, MD  02/21/2018, 10:13 AM

## 2018-02-21 NOTE — Transfer of Care (Signed)
Immediate Anesthesia Transfer of Care Note  Patient: [name] JunkerJennifer B Salsgiver  Procedure(s) Performed: ESOPHAGOGASTRODUODENOSCOPY (EGD) WITH PROPOFOL (N/A Throat) POLYPECTOMY (Throat)  Patient Location: PACU  Anesthesia Type: General  Level of Consciousness: awake, alert  and patient cooperative  Airway and Oxygen Therapy: Patient Spontanous Breathing and Patient connected to supplemental oxygen  Post-op Assessment: Post-op Vital signs reviewed, Patient's Cardiovascular Status Stable, Respiratory Function Stable, Patent Airway and No signs of Nausea or vomiting  Post-op Vital Signs: Reviewed and stable  Complications: No apparent anesthesia complications

## 2018-02-24 ENCOUNTER — Encounter: Payer: Self-pay | Admitting: Gastroenterology

## 2018-02-27 ENCOUNTER — Encounter: Payer: Self-pay | Admitting: Gastroenterology

## 2018-03-01 ENCOUNTER — Other Ambulatory Visit: Payer: Self-pay | Admitting: Physician Assistant

## 2018-03-01 DIAGNOSIS — K219 Gastro-esophageal reflux disease without esophagitis: Secondary | ICD-10-CM

## 2018-03-04 ENCOUNTER — Telehealth: Payer: Self-pay | Admitting: Gastroenterology

## 2018-03-04 NOTE — Telephone Encounter (Signed)
Patient called wanting her results from her upper endoscopy done on 02-21-18. She also wanted to know if the results showed acid reflux would she have to come back in or could she just be referred.

## 2018-03-05 ENCOUNTER — Other Ambulatory Visit: Payer: Self-pay

## 2018-03-05 NOTE — Telephone Encounter (Signed)
Pt notified results of her EGD were mailed on 02/27/18. She was also informed of these results. Pt would like to switch PPI's to see if her symptoms resolve with this medication. If no improvement, then she will be set up for a PH study. Samples of Dexilant provided.

## 2018-03-10 ENCOUNTER — Encounter: Payer: Self-pay | Admitting: Gastroenterology

## 2018-03-13 ENCOUNTER — Other Ambulatory Visit: Payer: Self-pay

## 2018-03-13 ENCOUNTER — Telehealth: Payer: Self-pay | Admitting: Gastroenterology

## 2018-03-13 MED ORDER — DEXLANSOPRAZOLE 60 MG PO CPDR: 60.0000 mg | DELAYED_RELEASE_CAPSULE | Freq: Every day | ORAL | 6 refills | Status: DC

## 2018-03-13 NOTE — Telephone Encounter (Signed)
Pt is calling she would like a rx send to pharmacy [facility] in [place] for rx Dexalin she states it has been working for her she would like a call once rx is send

## 2018-03-13 NOTE — Telephone Encounter (Signed)
Rx for Dexilant 60 mg has been faxed to [facility], [place] per pt request.

## 2018-04-18 ENCOUNTER — Telehealth: Payer: Self-pay | Admitting: Gastroenterology

## 2018-04-18 NOTE — Telephone Encounter (Signed)
Patient has been trying Dexilant and she feels it is not working. What else can she use? Please call.

## 2018-04-22 NOTE — Telephone Encounter (Signed)
Put the patient on protonix bid and if no help then unlikely the symptoms due to reflux.

## 2018-04-23 NOTE — Telephone Encounter (Signed)
Pt has already been on Protonix. We switched her to Dexilant. Pt will now need PT study. Requested a return call to see which location she would prefer. [place] or [place].

## 2018-04-24 ENCOUNTER — Telehealth: Payer: Self-pay | Admitting: Gastroenterology

## 2018-04-24 NOTE — Telephone Encounter (Signed)
Patient stated you spoke to her regarding a test and she has some questions about it. Please call

## 2018-04-25 ENCOUNTER — Other Ambulatory Visit: Payer: Self-pay

## 2018-04-25 NOTE — Telephone Encounter (Signed)
Pt has decided to do lifestyle adjustments and continue taking the Dexilant. Pt will contact me later if she decides to move forward with the PH study.

## 2018-04-28 ENCOUNTER — Encounter: Payer: Self-pay | Admitting: Physician Assistant

## 2018-04-28 DIAGNOSIS — T3695XA Adverse effect of unspecified systemic antibiotic, initial encounter: Principal | ICD-10-CM

## 2018-04-28 DIAGNOSIS — B379 Candidiasis, unspecified: Secondary | ICD-10-CM

## 2018-04-28 MED ORDER — FLUCONAZOLE 150 MG PO TABS: 150.0000 mg | ORAL_TABLET | Freq: Once | ORAL | 0 refills | Status: AC

## 2018-06-20 ENCOUNTER — Telehealth: Payer: Self-pay

## 2018-06-20 NOTE — Telephone Encounter (Signed)
Patient called saying that she needs to update her tetanus vaccine. I advised that Antony ContrasJenni did not have any appts today and that we could schedule another day. Patient declined.

## 2018-06-24 ENCOUNTER — Encounter: Payer: Self-pay | Admitting: Physician Assistant

## 2018-06-24 DIAGNOSIS — B379 Candidiasis, unspecified: Secondary | ICD-10-CM

## 2018-06-24 DIAGNOSIS — T3695XA Adverse effect of unspecified systemic antibiotic, initial encounter: Principal | ICD-10-CM

## 2018-06-25 MED ORDER — FLUCONAZOLE 150 MG PO TABS: 150.0000 mg | ORAL_TABLET | Freq: Once | ORAL | 0 refills | Status: AC

## 2018-10-09 ENCOUNTER — Other Ambulatory Visit: Payer: Self-pay

## 2018-10-09 MED ORDER — DEXLANSOPRAZOLE 60 MG PO CPDR: 60.0000 mg | DELAYED_RELEASE_CAPSULE | Freq: Every day | ORAL | 1 refills | Status: DC

## 2019-01-14 ENCOUNTER — Encounter: Payer: Self-pay | Admitting: Physician Assistant

## 2019-03-30 ENCOUNTER — Telehealth: Payer: Self-pay | Admitting: Gastroenterology

## 2019-03-30 ENCOUNTER — Other Ambulatory Visit: Payer: Self-pay

## 2019-03-30 MED ORDER — DEXLANSOPRAZOLE 60 MG PO CPDR: 60.0000 mg | DELAYED_RELEASE_CAPSULE | Freq: Every day | ORAL | 1 refills | Status: DC

## 2019-03-30 NOTE — Telephone Encounter (Signed)
Rx for Dexilant 60mg has been sent to pt's pharmacy.  

## 2019-03-30 NOTE — Telephone Encounter (Signed)
Received a fax for pt rx Dexilant 60 mg CAP( Formerly Kapidex) qyt [address] [facility] [address] [place] [phone]

## 2019-04-15 ENCOUNTER — Other Ambulatory Visit: Payer: Self-pay

## 2019-06-15 ENCOUNTER — Other Ambulatory Visit: Payer: Self-pay | Admitting: Certified Nurse Midwife

## 2019-06-15 DIAGNOSIS — Z1231 Encounter for screening mammogram for malignant neoplasm of breast: Secondary | ICD-10-CM

## 2019-07-22 ENCOUNTER — Other Ambulatory Visit: Payer: Self-pay

## 2019-07-22 ENCOUNTER — Ambulatory Visit
Admission: RE | Admit: 2019-07-22 | Discharge: 2019-07-22 | Disposition: A | Discharge status: Home / Self Care | Payer: PRIVATE HEALTH INSURANCE | Source: Ambulatory Visit | Attending: Certified Nurse Midwife | Admitting: Certified Nurse Midwife

## 2019-07-22 DIAGNOSIS — Z1231 Encounter for screening mammogram for malignant neoplasm of breast: Secondary | ICD-10-CM | POA: Insufficient documentation

## 2019-07-27 ENCOUNTER — Other Ambulatory Visit: Payer: Self-pay | Admitting: Certified Nurse Midwife

## 2019-07-27 DIAGNOSIS — R928 Other abnormal and inconclusive findings on diagnostic imaging of breast: Secondary | ICD-10-CM

## 2019-07-27 DIAGNOSIS — N6320 Unspecified lump in the left breast, unspecified quadrant: Secondary | ICD-10-CM

## 2019-08-04 ENCOUNTER — Ambulatory Visit
Admission: RE | Admit: 2019-08-04 | Discharge: 2019-08-04 | Disposition: A | Discharge status: Home / Self Care | Payer: PRIVATE HEALTH INSURANCE | Source: Ambulatory Visit | Attending: Certified Nurse Midwife | Admitting: Certified Nurse Midwife

## 2019-08-04 DIAGNOSIS — R928 Other abnormal and inconclusive findings on diagnostic imaging of breast: Secondary | ICD-10-CM | POA: Diagnosis present

## 2019-08-04 DIAGNOSIS — N6320 Unspecified lump in the left breast, unspecified quadrant: Secondary | ICD-10-CM

## 2019-10-01 ENCOUNTER — Other Ambulatory Visit: Payer: Self-pay | Admitting: Gastroenterology

## 2020-03-17 ENCOUNTER — Other Ambulatory Visit: Payer: Self-pay | Admitting: Certified Nurse Midwife

## 2020-03-17 DIAGNOSIS — Z1231 Encounter for screening mammogram for malignant neoplasm of breast: Secondary | ICD-10-CM

## 2020-04-10 ENCOUNTER — Encounter: Payer: Self-pay | Admitting: Emergency Medicine

## 2020-04-10 ENCOUNTER — Other Ambulatory Visit: Payer: Self-pay

## 2020-04-10 ENCOUNTER — Ambulatory Visit
Admission: EM | Admit: 2020-04-10 | Discharge: 2020-04-10 | Disposition: A | Discharge status: Home / Self Care | Payer: PRIVATE HEALTH INSURANCE | Attending: Family Medicine | Admitting: Family Medicine

## 2020-04-10 DIAGNOSIS — H6593 Unspecified nonsuppurative otitis media, bilateral: Secondary | ICD-10-CM | POA: Diagnosis present

## 2020-04-10 DIAGNOSIS — Z8616 Personal history of COVID-19: Secondary | ICD-10-CM

## 2020-04-10 DIAGNOSIS — R0981 Nasal congestion: Secondary | ICD-10-CM | POA: Insufficient documentation

## 2020-04-10 DIAGNOSIS — U071 COVID-19: Secondary | ICD-10-CM | POA: Diagnosis not present

## 2020-04-10 DIAGNOSIS — J0110 Acute frontal sinusitis, unspecified: Secondary | ICD-10-CM

## 2020-04-10 DIAGNOSIS — Z20822 Contact with and (suspected) exposure to covid-19: Secondary | ICD-10-CM

## 2020-04-10 HISTORY — DX: Personal history of COVID-19: Z86.16

## 2020-04-10 MED ORDER — AMOXICILLIN-POT CLAVULANATE 875-125 MG PO TABS: 1.0000 | ORAL_TABLET | Freq: Two times a day (BID) | ORAL | 0 refills | Status: AC

## 2020-04-10 MED ORDER — FLUCONAZOLE 150 MG PO TABS: ORAL_TABLET | ORAL | 0 refills | Status: DC

## 2020-04-10 NOTE — Discharge Instructions (Signed)
It was very nice seeing you today in clinic. Thank you for entrusting me with your care.   Rest and stay HYDRATED. Water and electrolyte containing beverages (Gatorade, Pedialyte) are best to prevent dehydration and electrolyte abnormalities.   May use Tylenol and/or Ibuprofen as needed for pain/fever.   Please utilize the medications that we discussed. Your prescriptions has been called in to your pharmacy.   You were tested for SARS-CoV-2 (novel coronavirus) today. Testing is performed by the main lab at [facility] in [place]. Testing has been taking 12-24 hours to result. Current recommendations from the the CDC and [place] DHHS require that you remain out of work in order to quarantine at home until negative test results are have been received. In the event that your test results are positive, you will be contacted with further directives. These measures are being implemented out of an abundance of caution to prevent transmission and spread during the current SARS-CoV-2 pandemic.  Make arrangements to follow up with your regular doctor in 1 week for re-evaluation if not improving. If your symptoms/condition worsens, please seek follow up care either here or in the ER. Please remember, our [facility] providers are "right here with you" when you need [place].   Again, it was my pleasure to take care of you today. Thank you for choosing our clinic. I hope that you start to feel better quickly.   Quentin Mulling, MSN, APRN, FNP-C, CEN Advanced Practice Provider [facility]

## 2020-04-10 NOTE — ED Triage Notes (Signed)
Patient in today c/o body aches, headache, runny nose, chills and decreased appetite x 6 days. Patient has taken OTC Nyquil/Dayquil and Tylenol.

## 2020-04-11 ENCOUNTER — Encounter: Payer: Self-pay | Admitting: Urgent Care

## 2020-04-11 LAB — SARS CORONAVIRUS 2 (TAT 6-24 HRS): SARS Coronavirus 2: POSITIVE — AB

## 2020-04-11 NOTE — ED Provider Notes (Addendum)
[name], [name]   Name: LOUISIANA [name] DOB: 12/28/1970 MRN: [id number] CSN: [id number] PCP: Margaretann Loveless, PA-C  Arrival date and time:  04/10/20 1444  Chief Complaint:  Generalized Body Aches, Headache, and Nasal Congestion  NOTE: Prior to seeing the patient today, I have reviewed the triage nursing documentation and vital signs. Clinical staff has updated patient's PMH/PSHx, current medication list, and drug allergies/intolerances to ensure comprehensive history available to assist in medical decision making.   History:   HPI: [name] [name] is a 49 y.o. female who presents today with complaints of cough, sore throat, frontal headache, sinus congestion, rhinorrhea, BILATERAL ear fullness, and diffuse myalgias that started approximately 6 days ago. She has had subjective fevers; Tmax unknown. She denies any cough, shortness of breath, or wheezing. PMH (+) for seasonal allergies for which she takes an unknown allergy medication daily.  She denies that she has experienced any nausea, vomiting, diarrhea, or abdominal pain. She notes a decreased appetite overall, however maintains the ability to tolerate oral fluids. Patient denies any perceived alterations to her sense of taste or smell. Patient denies being in close contact with anyone known to be ill; no one else is her home has experienced a similar symptom constellation. She has never been tested for SARS-CoV-2 (novel coronavirus) in the past per her report. In efforts to conservatively manage her symptoms at home, the patient notes that she has used Dayquil, Nyquil, APAP, and IBU, which have not helped to improve her symptoms.    Past Medical History:  Diagnosis Date  . GERD (gastroesophageal reflux disease)   . Motion sickness    roller coasters  . Wears contact lenses     Past Surgical History:  Procedure Laterality Date  . DILATION AND CURETTAGE OF UTERUS    . ECTOPIC PREGNANCY SURGERY    . ESOPHAGOGASTRODUODENOSCOPY (EGD)  WITH PROPOFOL N/A 02/21/2018   Procedure: ESOPHAGOGASTRODUODENOSCOPY (EGD) WITH PROPOFOL;  Surgeon: Midge Minium, MD;  Location: [facility];  Service: Endoscopy;  Laterality: N/A;  . POLYPECTOMY  02/21/2018   Procedure: POLYPECTOMY;  Surgeon: Midge Minium, MD;  Location: [facility];  Service: Endoscopy;;    Family History  Problem Relation Age of Onset  . Hypertension Mother   . Atrial fibrillation Mother   . Healthy Father   . Ovarian cancer Maternal Aunt   . Breast cancer Neg Hx     Social History   Tobacco Use  . Smoking status: Never Smoker  . Smokeless tobacco: Never Used  Substance Use Topics  . Alcohol use: No  . Drug use: No    Patient Active Problem List   Diagnosis Date Noted  . Heartburn   . Gastric polyp   . Gastroesophageal reflux disease 09/27/2016  . Hiatal hernia 09/27/2016  . History of hay fever 10/19/2015  . Foot pain 10/19/2015  . Breast lump 10/19/2015  . H/O gastrointestinal disease 10/19/2015  . 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 10/19/2015    Home Medications:    Current Meds  Medication Sig  . dexlansoprazole (DEXILANT) 60 MG capsule Take 1 capsule (60 mg total) by mouth daily. **PLEASE SCHEDULE FOLLOW UP APPT**  . famotidine (PEPCID) 40 MG tablet Take 1 tablet by mouth at bedtime.  . Homeopathic Products (ALLERGY MEDICINE PO) Take by mouth.  . IUD'S IU by Intrauterine route.  . MULTIPLE VITAMIN PO Take by mouth.  . Probiotic Product (PROBIOTIC DAILY PO) Take by mouth.    Allergies:   Sulfa antibiotics  Review of Systems (ROS):  Review of systems NEGATIVE unless otherwise noted in narrative H&P section.   Vital Signs: Today's Vitals   04/10/20 1459 04/10/20 1524  BP: 135/87   Pulse: (!) 103   Resp: 18   Temp: 98.2 F (36.8 C)   TempSrc: Oral   SpO2: 96%   Weight: 175 lb (79.4 kg)   Height: 5\' 2"  (1.575 m)   PainSc: 4  4     Physical Exam: Physical Exam  Constitutional: She is oriented to  person, place, and time and well-developed, well-nourished, and in no distress.  Acutely ill appearing; fatigued.  HENT:  Head: Normocephalic and atraumatic.  Right Ear: Tympanic membrane is injected and bulging. A middle ear effusion is present.  Left Ear: Tympanic membrane is injected. A middle ear effusion is present.  Nose: Mucosal edema, rhinorrhea and sinus tenderness present.  Mouth/Throat: Uvula is midline and mucous membranes are normal. Posterior oropharyngeal erythema present. No oropharyngeal exudate or posterior oropharyngeal edema.  Eyes: Pupils are equal, round, and reactive to light.  Cardiovascular: Regular rhythm, normal heart sounds and intact distal pulses. Tachycardia present.  Pulmonary/Chest: Effort normal and breath sounds normal.  No cough noted in clinic. No SOB or increased WOB. No distress. Able to speak in complete sentences without difficulties. SPO2 96% on RA.  Lymphadenopathy:       Head (right side): Submandibular adenopathy present.  Neurological: She is alert and oriented to person, place, and time. Gait normal.  Skin: Skin is warm and dry. No rash noted. She is not diaphoretic.  Psychiatric: Mood, memory, affect and judgment normal.  Nursing note and vitals reviewed.   Urgent Care Treatments / Results:   Orders Placed This Encounter  Procedures  . SARS CORONAVIRUS 2 (TAT 6-24 HRS) Nasopharyngeal Nasopharyngeal Swab    LABS: PLEASE NOTE: all labs that were ordered this encounter are listed, however only abnormal results are displayed.   SARS CORONAVIRUS 2 (TAT 6-24 HRS) - PENDING  EKG: -None  RADIOLOGY: No results found.  PROCEDURES: Procedures  MEDICATIONS RECEIVED THIS VISIT: Medications - No data to display  PERTINENT CLINICAL COURSE NOTES/UPDATES:   Initial Impression / Assessment and Plan / Urgent Care Course:  Pertinent labs & imaging results that were available during my care of the patient were personally reviewed by me and  considered in my medical decision making (see lab/imaging section of note for values and interpretations).  [name] [name] is a 49 y.o. female who presents to [facility] today with complaints of Generalized Body Aches, Headache, and Nasal Congestion  Patient acutely ill appearing (non-toxic) appearing in clinic today. She does not appear to be in any acute distress. Presenting symptoms (see HPI) and exam as documented above. She presents with symptoms associated with SARS-CoV-2 (novel coronavirus). Discussed typical symptom constellation. Reviewed potential for infection and need for testing. Patient amenable to being tested. SARS-CoV-2 swab collected by certified clinical staff. Discussed variable turn around times associated with testing, as swabs are being processed at the main campus of [facility] in [place], and have been taking 12-24 hours to come back. She was advised to self quarantine, per [facility] DHHS guidelines, until negative results received. These measures are being implemented out of an abundance of caution to prevent transmission and spread during the current SARS-CoV-2 pandemic.  Exam reveals sinus congestion and MEEs in patient's BILATERAL ears (R>L). She has had subjective fevers. Symptoms x 6 days.  Discussed that, ntil ruled out with confirmatory lab  testing, SARS-CoV-2 remains part of the differential. Her testing is pending at this time. Given the chronicity of her symptoms, coupled with her physical exam today, it is reasonable to proceed with treatment using a 7 day course of amoxicillin-clavulanate. Discussed that there is the potential for concurrent bacterial and viral infections. Discussed supportive care measures at home during acute phase of illness. Patient to rest as much as possible. She was encouraged to ensure adequate hydration (water and ORS) to prevent dehydration and electrolyte derangements. Recommended warm salt water gargles, hard candies/lozenges, and hot tea  with honey/lemon to help soothe the throat and reduce irritation. May use Tylenol and/or Ibuprofen as needed for pain/fever.  Patient has has a history of vulvovaginal candidiasis in the past while on oral antimicrobial therapy. Will send in prophylactic fluconazole dose (150 mg x 1 - may repeat in 72 hours if still symptomatic) for patient to use should she develop symptoms.  Current clinical condition warrants patient being out of work in order to quarantine while waiting for testing results. She was provided with the appropriate documentation to provide to her place of employment that will allow for her to RTW on 04/13/2020 with no restrictions. RTW is contingent on her SARS-CoV-2 test results being reviewed as negative.     Discussed follow up with primary care physician in 1 week for re-evaluation. I have reviewed the follow up and strict return precautions for any new or worsening symptoms. Patient is aware of symptoms that would be deemed urgent/emergent, and would thus require further evaluation either here or in the emergency department. At the time of discharge, she verbalized understanding and consent with the discharge plan as it was reviewed with her. All questions were fielded by provider and/or clinic staff prior to patient discharge.    Final Clinical Impressions / Urgent Care Diagnoses:   Final diagnoses:  Sinus congestion  MEE (middle ear effusion), bilateral  Encounter for laboratory testing for COVID-19 virus    New Prescriptions:  [name] [name] Controlled Substance Registry consulted? Not Applicable  Meds ordered this encounter  Medications  . amoxicillin-clavulanate (AUGMENTIN) 875-125 MG tablet    Sig: Take 1 tablet by mouth 2 (two) times daily for 7 days.    Dispense:  14 tablet    Refill:  0  . fluconazole (DIFLUCAN) 150 MG tablet    Sig: Take 1 tablet (150 mg) PO x 1 dose. May repeat 150 mg dose in 3 days if still symptomatic.    Dispense:  2 tablet    Refill:  0     Recommended Follow up Care:  Patient encouraged to follow up with the following provider within the specified time frame, or sooner as dictated by the severity of her symptoms. As always, she was instructed that for any urgent/emergent care needs, she should seek care either here or in the emergency department for more immediate evaluation.  Follow-up Information    [name] [name], Krinsky, PA-C In 1 week.   Specialty: Family Medicine Why: General reassessment of symptoms if not improving Contact information: [address] [place] [address] [phone]         NOTE: This note was prepared using Dragon dictation software along with smaller phrase technology. Despite my best ability to proofread, there is the potential that transcriptional errors may still occur from this process, and are completely unintentional.    Karen Kitchens, NP 04/11/20 2123

## 2020-07-22 ENCOUNTER — Ambulatory Visit
Admission: RE | Admit: 2020-07-22 | Discharge: 2020-07-22 | Disposition: A | Discharge status: Home / Self Care | Payer: PRIVATE HEALTH INSURANCE | Source: Ambulatory Visit | Attending: Certified Nurse Midwife | Admitting: Certified Nurse Midwife

## 2020-07-22 ENCOUNTER — Other Ambulatory Visit: Payer: Self-pay

## 2020-07-22 DIAGNOSIS — Z1231 Encounter for screening mammogram for malignant neoplasm of breast: Secondary | ICD-10-CM | POA: Diagnosis present

## 2021-03-24 ENCOUNTER — Other Ambulatory Visit: Payer: Self-pay | Admitting: Podiatry

## 2021-03-24 ENCOUNTER — Other Ambulatory Visit: Payer: Self-pay

## 2021-03-24 ENCOUNTER — Encounter: Payer: Self-pay | Admitting: Podiatry

## 2021-03-24 ENCOUNTER — Ambulatory Visit (INDEPENDENT_AMBULATORY_CARE_PROVIDER_SITE_OTHER): Payer: PRIVATE HEALTH INSURANCE

## 2021-03-24 ENCOUNTER — Ambulatory Visit: Payer: PRIVATE HEALTH INSURANCE | Admitting: Podiatry

## 2021-03-24 DIAGNOSIS — M2141 Flat foot [pes planus] (acquired), right foot: Secondary | ICD-10-CM

## 2021-03-24 DIAGNOSIS — M2012 Hallux valgus (acquired), left foot: Secondary | ICD-10-CM

## 2021-03-24 DIAGNOSIS — M2142 Flat foot [pes planus] (acquired), left foot: Secondary | ICD-10-CM | POA: Diagnosis not present

## 2021-03-24 DIAGNOSIS — M778 Other enthesopathies, not elsewhere classified: Secondary | ICD-10-CM | POA: Diagnosis not present

## 2021-03-24 DIAGNOSIS — M722 Plantar fascial fibromatosis: Secondary | ICD-10-CM

## 2021-03-24 MED ORDER — MELOXICAM 15 MG PO TABS: 15.0000 mg | ORAL_TABLET | Freq: Every day | ORAL | 1 refills | Status: DC

## 2021-03-24 NOTE — Progress Notes (Signed)
   Subjective: 50 y.o. female presents today as a new patient for evaluation of chronic bilateral foot pain.  Patient states that she self diagnosed herself with flatfeet.  She gets constant achiness and pain with stiffness throughout the day.  She is not necessarily on her feet a lot during the day.  She states that in the mornings it is very stiff and achy.    Patient also has a bunion deformity to the left great toe that is very symptomatic intermittently.  This is been present for several years.  Certain shoes are aggravating to the bunion area.  She presents for further treatment evaluation   Past Medical History:  Diagnosis Date  . GERD (gastroesophageal reflux disease)   . History of 2019 novel coronavirus disease (COVID-19) 04/10/2020  . Motion sickness    roller coasters  . Wears contact lenses       Objective: Physical Exam General: The patient is alert and oriented x3 in no acute distress.  Dermatology: Skin is cool, dry and supple bilateral lower extremities. Negative for open lesions or macerations.  Vascular: Palpable pedal pulses bilaterally. No edema or erythema noted. Capillary refill within normal limits.  Neurological: Epicritic and protective threshold grossly intact bilaterally.   Musculoskeletal Exam: Clinical evidence of bunion deformity noted to the respective foot. There is moderate pain on palpation range of motion of the first MPJ. Lateral deviation of the hallux noted consistent with hallux abductovalgus.  Collapse of the medial longitudinal arch noted with loading of the foot.  There is also diffuse pain on palpation throughout the midtarsal joints of the foot  Radiographic Exam: Increased intermetatarsal angle greater than 15 with a hallux abductus angle greater than 30 noted on AP view. Moderate degenerative changes noted within the first MPJ. Collapse of the medial longitudinal arch with a decreased calcaneal inclination angle and metatarsal declination  angle noted on lateral view consistent with pes planus deformity  Assessment: 1. HAV w/ bunion deformity left 2.  Pes planus bilateral 3.  Capsulitis bilateral feet/generalized foot achiness   Plan of Care:  1. Patient was evaluated. X-Rays reviewed. 2.  Prior to bunion intervention and surgery, I do recommend conservative treatment modalities to see if that helps alleviate some of her symptoms 3.  Prescription for meloxicam 15 mg daily 4.  Appointment with Pedorthist for custom molded orthotics 5.  Return to clinic in 3 months  *Works for city of [place], [place].      Felecia Shelling, DPM [facility]  Dr. Felecia Shelling, DPM    [address] [place], [place] [address]                Office [phone]  Fax [phone]

## 2021-04-05 ENCOUNTER — Other Ambulatory Visit: Payer: Self-pay

## 2021-04-05 ENCOUNTER — Other Ambulatory Visit: Payer: PRIVATE HEALTH INSURANCE

## 2021-04-05 DIAGNOSIS — M2141 Flat foot [pes planus] (acquired), right foot: Secondary | ICD-10-CM

## 2021-04-05 DIAGNOSIS — M2012 Hallux valgus (acquired), left foot: Secondary | ICD-10-CM | POA: Diagnosis not present

## 2021-04-05 DIAGNOSIS — M2142 Flat foot [pes planus] (acquired), left foot: Secondary | ICD-10-CM

## 2021-04-05 DIAGNOSIS — M778 Other enthesopathies, not elsewhere classified: Secondary | ICD-10-CM

## 2021-04-26 ENCOUNTER — Other Ambulatory Visit: Payer: Self-pay

## 2021-04-26 ENCOUNTER — Ambulatory Visit: Payer: PRIVATE HEALTH INSURANCE

## 2021-04-26 DIAGNOSIS — M2012 Hallux valgus (acquired), left foot: Secondary | ICD-10-CM

## 2021-04-26 DIAGNOSIS — M2141 Flat foot [pes planus] (acquired), right foot: Secondary | ICD-10-CM

## 2021-04-26 NOTE — Progress Notes (Signed)
Patient presents for orthotic pick up.  Verbal and written break in and wear instructions given.  Patient will follow up in 4 weeks with Dr if symptoms worsen or fail to improve. 

## 2021-04-26 NOTE — Patient Instructions (Signed)

## 2021-05-23 ENCOUNTER — Other Ambulatory Visit: Payer: Self-pay | Admitting: Podiatry

## 2021-05-24 NOTE — Telephone Encounter (Signed)
Please advise 

## 2021-05-30 ENCOUNTER — Ambulatory Visit: Payer: PRIVATE HEALTH INSURANCE | Admitting: Podiatry

## 2021-06-13 ENCOUNTER — Other Ambulatory Visit: Payer: Self-pay

## 2021-06-13 ENCOUNTER — Ambulatory Visit: Payer: Self-pay | Admitting: *Deleted

## 2021-06-13 ENCOUNTER — Encounter: Payer: Self-pay | Admitting: Nurse Practitioner

## 2021-06-13 ENCOUNTER — Ambulatory Visit: Payer: PRIVATE HEALTH INSURANCE | Admitting: Nurse Practitioner

## 2021-06-13 VITALS — BP 124/72 | HR 76 | Temp 98.6°F | Wt 181.2 lb

## 2021-06-13 DIAGNOSIS — M25562 Pain in left knee: Secondary | ICD-10-CM

## 2021-06-13 DIAGNOSIS — M25462 Effusion, left knee: Secondary | ICD-10-CM | POA: Diagnosis not present

## 2021-06-13 NOTE — Telephone Encounter (Signed)
Patient called in to say that on 06/12/21 she noticed swelling behind her knee with a knot in it need some instruction please Ph# [phone]  Reason for Disposition  [1] Painless swelling behind knee AND [2] bulges out when knee is straightened (extended)  Answer Assessment - Initial Assessment Questions 1. LOCATION: "Where is the swelling located?"  (e.g., left, right, both knees)     Knot behind left knee 2. SIZE and DESCRIPTION: "What does the swelling look like?"  (e.g., entire knee, localized)     Puffy behind left knee 3. ONSET: "When did the swelling start?" "Does it come and go, or is it there all the time?"     yesterday 4. PAIN: "Is there any pain?" If Yes, ask: "How bad is it?" (Scale 1-10; or mild, moderate, severe)     Tender to palpitation 5. SETTING: "Has there been any recent work, exercise or other activity that involved that part of the body?"      No  6. AGGRAVATING FACTORS: "What makes the knee swelling worse?" (e.g., walking, climbing stairs, running)     More swollen throughout the day 7. ASSOCIATED SYMPTOMS: "Is there any pain or redness?"     no 8. OTHER SYMPTOMS: "Do you have any other symptoms?" (e.g., chest pain, difficulty breathing, fever, calf pain)     Knot- soft, pea size 9. PREGNANCY: "Is there any chance you are pregnant?" "When was your last menstrual period?"     IUD  Protocols used: Knee Swelling-A-AH

## 2021-06-13 NOTE — Telephone Encounter (Signed)
Patient is calling to report she has small knot she discovered yesterday- behind her Left knee. Pea size, tender to palpation with puffiness behind the knee. Patient states she does not have discoloration, pain, or swelling anywhere else in leg. Advised UC for evaluation- no open appointment in the office- patient prefers to be seen in office - will send not to office for review- patient request call back.

## 2021-06-13 NOTE — Telephone Encounter (Signed)
Appt today with Lauren at [facility].

## 2021-06-13 NOTE — Progress Notes (Signed)
New Patient Office Visit  Subjective:  Patient ID: [name] [name], female    DOB: 1971/12/07  Age: 50 y.o. MRN: [id number]  CC:  Chief Complaint  Patient presents with   Cyst    Pt states she has found a small knot in the back of her left knee yesterday. States it doesn't hurt unless she touches or puts pressure on the area     HPI [name] [name] presents for pain and a small knot to the back of her left knee. She noticed it for the first time yesterday, however she said it could have been there before. She has pain when she touches it, however there is no pain otherwise. She has noticed swelling to the back of her knee. She denies injury, fever, warmth, or redness. She has not taken any over the counter to help with her symptoms.   Past Medical History:  Diagnosis Date   GERD (gastroesophageal reflux disease)    History of 2019 novel coronavirus disease (COVID-19) 04/10/2020   Motion sickness    roller coasters   Wears contact lenses     Past Surgical History:  Procedure Laterality Date   DILATION AND CURETTAGE OF UTERUS     ECTOPIC PREGNANCY SURGERY     ESOPHAGOGASTRODUODENOSCOPY (EGD) WITH PROPOFOL N/A 02/21/2018   Procedure: ESOPHAGOGASTRODUODENOSCOPY (EGD) WITH PROPOFOL;  Surgeon: Midge Minium, MD;  Location: [facility];  Service: Endoscopy;  Laterality: N/A;   POLYPECTOMY  02/21/2018   Procedure: POLYPECTOMY;  Surgeon: Midge Minium, MD;  Location: [facility];  Service: Endoscopy;;    Family History  Problem Relation Age of Onset   Hypertension Mother    Atrial fibrillation Mother    Healthy Father    Ovarian cancer Maternal Aunt    Breast cancer Neg Hx     Social History   Socioeconomic History   Marital status: Married    Spouse name: Not on file   Number of children: Not on file   Years of education: Not on file   Highest education level: Not on file  Occupational History   Not on file  Tobacco Use   Smoking status: Never   Smokeless  tobacco: Never  Vaping Use   Vaping Use: Never used  Substance and Sexual Activity   Alcohol use: No   Drug use: No   Sexual activity: Not on file  Other Topics Concern   Not on file  Social History Narrative   Not on file   Social Determinants of Health   Financial Resource Strain: Not on file  Food Insecurity: Not on file  Transportation Needs: Not on file  Physical Activity: Not on file  Stress: Not on file  Social Connections: Not on file  Intimate Partner Violence: Not on file    ROS Review of Systems  Constitutional: Negative.   HENT: Negative.    Eyes: Negative.   Respiratory: Negative.    Cardiovascular: Negative.   Gastrointestinal: Negative.   Genitourinary: Negative.   Musculoskeletal:  Positive for joint swelling (left knee).  Skin: Negative.   Neurological: Negative.   Psychiatric/Behavioral: Negative.     Objective:   Today's Vitals: BP 124/72   Pulse 76   Temp 98.6 F (37 C) (Oral)   Wt 181 lb 3.2 oz (82.2 kg)   SpO2 96%   BMI 33.14 kg/m   Physical Exam Vitals and nursing note reviewed.  Constitutional:      General: She is not in acute distress.  Appearance: Normal appearance.  HENT:     Head: Normocephalic.  Eyes:     General:        Right eye: Discharge present.  Musculoskeletal:        General: Swelling (back of left knee, no redness or warmth) present. Normal range of motion.     Cervical back: Normal range of motion.  Skin:    General: Skin is warm.  Neurological:     General: No focal deficit present.     Mental Status: She is alert and oriented to person, place, and time.  Psychiatric:        Mood and Affect: Mood normal.        Behavior: Behavior normal.        Thought Content: Thought content normal.        Judgment: Judgment normal.    Assessment & Plan:   Problem List Items Addressed This Visit   None Visit Diagnoses     Pain and swelling of left knee    -  Primary   Can use ice to help with swelling. Tylenol  for pain since she takes mobic daily. Ultrasound to rule out bakers cyst. Await results for further treatment/plan   Relevant Orders   [place] LT LOWER EXTREM LTD SOFT TISSUE NON VASCULAR       Outpatient Encounter Medications as of 06/13/2021  Medication Sig   dexlansoprazole (DEXILANT) 60 MG capsule Take 1 capsule (60 mg total) by mouth daily. **PLEASE SCHEDULE FOLLOW UP APPT**   famotidine (PEPCID) 40 MG tablet Take 1 tablet by mouth at bedtime.   Homeopathic Products (ALLERGY MEDICINE PO) Take by mouth.   levonorgestrel (MIRENA) 20 MCG/24HR IUD by Intrauterine route.   meloxicam (MOBIC) 15 MG tablet TAKE 1 TABLET(15 MG) BY MOUTH DAILY   MULTIPLE VITAMIN PO Take by mouth.   Probiotic Product (PROBIOTIC DAILY PO) Take by mouth.   [DISCONTINUED] diphenhydrAMINE (SOMINEX) 25 MG tablet Take by mouth.   [DISCONTINUED] IUD'S IU by Intrauterine route.   [DISCONTINUED] pantoprazole (PROTONIX) 20 MG tablet TAKE 1 TO 2 TABLETS BY MOUTH DAILY   No facility-administered encounter medications on file as of 06/13/2021.    Follow-up: Return if symptoms worsen or fail to improve.   Gerre Scull, NP

## 2021-06-16 ENCOUNTER — Ambulatory Visit
Admission: EM | Admit: 2021-06-16 | Discharge: 2021-06-16 | Disposition: A | Discharge status: Home / Self Care | Payer: No Typology Code available for payment source | Attending: Physician Assistant | Admitting: Physician Assistant

## 2021-06-16 ENCOUNTER — Other Ambulatory Visit: Payer: Self-pay

## 2021-06-16 ENCOUNTER — Encounter: Payer: Self-pay | Admitting: Emergency Medicine

## 2021-06-16 DIAGNOSIS — T148XXA Other injury of unspecified body region, initial encounter: Secondary | ICD-10-CM

## 2021-06-16 DIAGNOSIS — M79605 Pain in left leg: Secondary | ICD-10-CM

## 2021-06-16 MED ORDER — PREDNISONE 10 MG PO TABS: ORAL_TABLET | ORAL | 0 refills | Status: DC

## 2021-06-16 NOTE — ED Triage Notes (Signed)
Patient states that she pulled that she pulled her hamstring in her left leg last night.  Patient reports ongoing pain at the back of her upper left leg.

## 2021-06-16 NOTE — ED Provider Notes (Signed)
[facility]    CSN: [id number] Arrival date & time: 06/16/21  1056      History   Chief Complaint Chief Complaint  Patient presents with   Leg Pain    left    HPI [name] [name] is a 50 y.o. female presenting for pain of the left posterior thigh and buttock since yesterday.  Patient states that she was playing softball and stretching at the base.  She says that she ended up "doing the splits" and falling onto her back.  She says that whenever she did the stretch she felt a "pop" in the back of her leg.  Patient believes she may have torn a muscle.  She says that she does have full range of motion of her hip, back and knee but has some discomfort when she applies pressure to her left leg.  She denies any back pain at all.  Patient says she has been dealing with some swelling of her posterior knee for a little while.  Patient seen by her PCP a few days ago and she does have a suspected Baker's cyst.  Patient has been set up to have an ultrasound but says its not for a few more weeks.  She currently takes daily meloxicam for chronic foot pain.  Patient was also taken ibuprofen and Tylenol with mild improvement in her pain.  No numbness, weakness or tingling.  Patient has history of sciatica of the opposite side.  No other concerns.  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 10/19/2015    Past Surgical History:  Procedure Laterality Date   DILATION AND CURETTAGE OF UTERUS      ECTOPIC PREGNANCY SURGERY     ESOPHAGOGASTRODUODENOSCOPY (EGD) WITH PROPOFOL N/A 02/21/2018   Procedure: ESOPHAGOGASTRODUODENOSCOPY (EGD) WITH PROPOFOL;  Surgeon: Midge Minium, MD;  Location: [facility];  Service: Endoscopy;  Laterality: N/A;   POLYPECTOMY  02/21/2018   Procedure: POLYPECTOMY;  Surgeon: Midge Minium, MD;  Location: [facility];  Service: Endoscopy;;    OB History   No obstetric history on file.      Home Medications    Prior to Admission medications   Medication Sig Start Date End Date Taking? Authorizing Provider  dexlansoprazole (DEXILANT) 60 MG capsule Take 1 capsule (60 mg total) by mouth daily. **PLEASE SCHEDULE FOLLOW UP APPT** 10/02/19  Yes Midge Minium, MD  famotidine (PEPCID) 40 MG tablet Take 1 tablet by mouth at bedtime. 01/14/20  Yes [provider]  levonorgestrel (MIRENA) 20 MCG/24HR IUD by Intrauterine route.   Yes [provider]  meloxicam (MOBIC) 15 MG tablet TAKE 1 TABLET(15 MG) BY MOUTH DAILY 05/24/21  Yes Felecia Shelling, DPM  MULTIPLE VITAMIN PO Take by mouth.   Yes [provider]  predniSONE (DELTASONE) 10 MG tablet Take 6 tablets by mouth today and decrease by 1 tablet daily until complete 06/16/21  Yes Shirlee Latch, PA-C  Probiotic Product (  PROBIOTIC DAILY PO) Take by mouth.   Yes [provider]  Homeopathic Products (ALLERGY MEDICINE PO) Take by mouth.    [provider]  pantoprazole (PROTONIX) 20 MG tablet TAKE 1 TO 2 TABLETS BY MOUTH DAILY 03/03/18 04/10/20  Margaretann LovelessBurnette, Audrinna M, PA-C    Family History Family History  Problem Relation Age of Onset   Hypertension Mother    Atrial fibrillation Mother    Healthy Father    Ovarian cancer Maternal Aunt    Breast cancer Neg Hx     Social History Social History   Tobacco Use   Smoking status: Never   Smokeless tobacco: Never  Vaping Use   Vaping Use: Never used  Substance Use Topics   Alcohol use: No   Drug use: No      Allergies   Sulfa antibiotics   Review of Systems Review of Systems  Constitutional:  Negative for fatigue.  Musculoskeletal:  Positive for joint swelling. Negative for arthralgias, back pain and gait problem.  Skin:  Negative for rash and wound.  Neurological:  Negative for weakness and numbness.    Physical Exam Triage Vital Signs ED Triage Vitals  Enc Vitals Group     BP 06/16/21 1119 (!) 144/87     Pulse Rate 06/16/21 1119 79     Resp 06/16/21 1119 14     Temp 06/16/21 1119 98.7 F (37.1 C)     Temp Source 06/16/21 1119 Oral     SpO2 06/16/21 1119 100 %     Weight 06/16/21 1117 175 lb (79.4 kg)     Height 06/16/21 1117 5\' 2"  (1.575 m)     Head Circumference --      Peak Flow --      Pain Score 06/16/21 1117 2     Pain Loc --      Pain Edu? --      Excl. in GC? --    No data found.  Updated Vital Signs BP (!) 144/87 (BP Location: Left Arm)   Pulse 79   Temp 98.7 F (37.1 C) (Oral)   Resp 14   Ht 5\' 2"  (1.575 m)   Wt 175 lb (79.4 kg)   SpO2 100%   BMI 32.01 kg/m       Physical Exam Vitals and nursing note reviewed.  Constitutional:      General: She is not in acute distress.    Appearance: Normal appearance. She is not ill-appearing or toxic-appearing.  HENT:     Head: Normocephalic and atraumatic.  Eyes:     General: No scleral icterus.       Right eye: No discharge.        Left eye: No discharge.     Conjunctiva/sclera: Conjunctivae normal.  Cardiovascular:     Rate and Rhythm: Normal rate and regular rhythm.     Heart sounds: Normal heart sounds.  Pulmonary:     Effort: Pulmonary effort is normal. No respiratory distress.     Breath sounds: Normal breath sounds.  Musculoskeletal:     Cervical back: Neck supple.     Lumbar back: No tenderness. Normal range of motion. Positive left straight leg raise test (increased pain in leg with SLR).     Left upper leg: Tenderness (diffuse TTP of entire posterior thigh. Quadriceps tendons intact at  knee. Mild swelling posterior knee. Full ROM of knee.) present. No swelling or bony tenderness.       Legs:     Comments:  Also has TTP of left buttocks at sciatic notch  Skin:    General: Skin is dry.  Neurological:     General: No focal deficit present.     Mental Status: She is alert. Mental status is at baseline.     Motor: No weakness.     Gait: Gait normal.  Psychiatric:        Mood and Affect: Mood normal.        Behavior: Behavior normal.        Thought Content: Thought content normal.      UC Treatments / Results  Labs (all labs ordered are listed, but only abnormal results are displayed) Labs Reviewed - No data to display  EKG   Radiology No results found.  Procedures Procedures (including critical care time)  Medications Ordered in UC Medications - No data to display  Initial Impression / Assessment and Plan / UC Course  I have reviewed the triage vital signs and the nursing notes.  Pertinent labs & imaging results that were available during my care of the patient were reviewed by me and considered in my medical decision making (see chart for details).  50 year old female presenting for pain throughout the left posterior thigh and buttocks.  Patient reports injury when she accidentally did the splits while stretching yesterday.  Patient states that she heard/felt a pop.  She does have diffuse tenderness throughout the quadriceps tendons and left buttocks at the sciatic notch.  Increased pain in these areas with straight leg raise.  No back tenderness.  Based on patient's symptoms and exam, this is likely muscle strain but there could be an element of sciatic irritation and inflammation as well.  Sent in prednisone and advised her to continue her daily home medications.  Patient declines a muscle relaxer stating that she has one at home.  Supportive care encouraged with applying heat and also trying ice on the area.  Advised stretches once her pain started to  improve.  Advised close follow-up in to make sure she receives her knee ultrasound.  I did offer her a knee brace and also crutches but she declines.  ED precautions for her condition reviewed.  Advised if she is not improving or symptoms are worsening then she may need to see Ortho.  Advised [facility] walk-in urgent care in [place] if symptoms acutely worsen or are not improving the next couple weeks.   Final Clinical Impressions(s) / UC Diagnoses   Final diagnoses:  Left leg pain  Muscle strain     Discharge Instructions      As we discussed, your pain could be due to a strain of the muscles the back of your leg versus possible inflammation of your sciatic nerve.  Pain is in the same area in both conditions a lot of the times.  At this time I would advise you to continue your daily meloxicam and Tylenol.  I have added prednisone which should help more with the inflammation.  I would also encourage you to apply heat and/or ice to the area to see if that helps.  Try to do some stretches once the pain starts to improve.  I would advise following up with Ortho if you have not had any improvement in the next couple of weeks or if your symptoms worsen.  Go directly to [facility] walk-in urgent care in [place] without a referral.  Make sure you get your ultrasound of the knee as well.  You were knee condition is likely not  related to your upper leg pain.  You may also follow-up with your PCP as well.  Go to the emergency department for any severe acute worsening of your pain or if you have leg weakness or numbness.   ED Prescriptions     Medication Sig Dispense Auth. Provider   predniSONE (DELTASONE) 10 MG tablet Take 6 tablets by mouth today and decrease by 1 tablet daily until complete 21 tablet Shirlee Latch, PA-C      PDMP not reviewed this encounter.   Shirlee Latch, PA-C 06/16/21 1223

## 2021-06-16 NOTE — Discharge Instructions (Addendum)
As we discussed, your pain could be due to a strain of the muscles the back of your leg versus possible inflammation of your sciatic nerve.  Pain is in the same area in both conditions a lot of the times.  At this time I would advise you to continue your daily meloxicam and Tylenol.  I have added prednisone which should help more with the inflammation.  I would also encourage you to apply heat and/or ice to the area to see if that helps.  Try to do some stretches once the pain starts to improve.  I would advise following up with Ortho if you have not had any improvement in the next couple of weeks or if your symptoms worsen.  Go directly to [facility] walk-in urgent care in [place] without a referral.  Make sure you get your ultrasound of the knee as well.  You were knee condition is likely not related to your upper leg pain.  You may also follow-up with your PCP as well.  Go to the emergency department for any severe acute worsening of your pain or if you have leg weakness or numbness.

## 2021-06-23 ENCOUNTER — Ambulatory Visit (INDEPENDENT_AMBULATORY_CARE_PROVIDER_SITE_OTHER): Payer: BC Managed Care – PPO | Admitting: Podiatry

## 2021-06-23 ENCOUNTER — Other Ambulatory Visit: Payer: Self-pay

## 2021-06-23 DIAGNOSIS — M778 Other enthesopathies, not elsewhere classified: Secondary | ICD-10-CM

## 2021-06-23 DIAGNOSIS — M2012 Hallux valgus (acquired), left foot: Secondary | ICD-10-CM | POA: Diagnosis not present

## 2021-06-23 DIAGNOSIS — M2142 Flat foot [pes planus] (acquired), left foot: Secondary | ICD-10-CM | POA: Diagnosis not present

## 2021-06-23 DIAGNOSIS — M2141 Flat foot [pes planus] (acquired), right foot: Secondary | ICD-10-CM | POA: Diagnosis not present

## 2021-06-27 ENCOUNTER — Other Ambulatory Visit: Payer: Self-pay | Admitting: Certified Nurse Midwife

## 2021-06-27 DIAGNOSIS — Z1231 Encounter for screening mammogram for malignant neoplasm of breast: Secondary | ICD-10-CM

## 2021-07-04 NOTE — Progress Notes (Signed)
   Subjective: 50 y.o. female presents today for follow-up evaluation of chronic bilateral foot pain.  Patient states that she feels much better.  She is still taking the meloxicam.  She has no swelling or redness.  Overall she does feel some improvement.   Past Medical History:  Diagnosis Date   GERD (gastroesophageal reflux disease)    History of 2019 novel coronavirus disease (COVID-19) 04/10/2020   Motion sickness    roller coasters   Wears contact lenses       Objective: Physical Exam General: The patient is alert and oriented x3 in no acute distress.  Dermatology: Skin is cool, dry and supple bilateral lower extremities. Negative for open lesions or macerations.  Vascular: Palpable pedal pulses bilaterally. No edema or erythema noted. Capillary refill within normal limits.  Neurological: Epicritic and protective threshold grossly intact bilaterally.   Musculoskeletal Exam: Clinical evidence of bunion deformity noted to the respective foot.  Today there is no pain on palpation range of motion of the first MPJ. Lateral deviation of the hallux noted consistent with hallux abductovalgus.  Collapse of the medial longitudinal arch noted with loading of the foot.  There is also improved pain on palpation throughout the midtarsal joints of the foot  Radiographic Exam taken last visit: Increased intermetatarsal angle greater than 15 with a hallux abductus angle greater than 30 noted on AP view. Moderate degenerative changes noted within the first MPJ. Collapse of the medial longitudinal arch with a decreased calcaneal inclination angle and metatarsal declination angle noted on lateral view consistent with pes planus deformity  Assessment: 1. HAV w/ bunion deformity left 2.  Pes planus bilateral 3.  Capsulitis bilateral feet/generalized foot achiness   Plan of Care:  1. Patient was evaluated.  2.  Continue custom molded orthotics or Aetrex insoles 3.  Continue meloxicam 15 mg  daily as needed 4.  Return to clinic as needed  *Works for [employer] [place], [place].      Felecia Shelling, DPM [facility]  Dr. Felecia Shelling, DPM    [address] [place], [place] [address]                Office [phone]  Fax [phone]

## 2021-07-14 ENCOUNTER — Other Ambulatory Visit: Payer: Self-pay

## 2021-07-14 ENCOUNTER — Ambulatory Visit
Admission: RE | Admit: 2021-07-14 | Discharge: 2021-07-14 | Disposition: A | Discharge status: Home / Self Care | Payer: BC Managed Care – PPO | Source: Ambulatory Visit | Attending: Nurse Practitioner | Admitting: Nurse Practitioner

## 2021-07-14 DIAGNOSIS — M25562 Pain in left knee: Secondary | ICD-10-CM | POA: Insufficient documentation

## 2021-07-14 DIAGNOSIS — M25462 Effusion, left knee: Secondary | ICD-10-CM | POA: Insufficient documentation

## 2021-07-14 DIAGNOSIS — R2242 Localized swelling, mass and lump, left lower limb: Secondary | ICD-10-CM | POA: Diagnosis not present

## 2021-07-23 ENCOUNTER — Other Ambulatory Visit: Payer: Self-pay | Admitting: Podiatry

## 2021-07-24 ENCOUNTER — Ambulatory Visit
Admission: RE | Admit: 2021-07-24 | Discharge: 2021-07-24 | Disposition: A | Discharge status: Home / Self Care | Payer: BC Managed Care – PPO | Source: Ambulatory Visit | Attending: Certified Nurse Midwife | Admitting: Certified Nurse Midwife

## 2021-07-24 ENCOUNTER — Other Ambulatory Visit: Payer: Self-pay

## 2021-07-24 DIAGNOSIS — Z1231 Encounter for screening mammogram for malignant neoplasm of breast: Secondary | ICD-10-CM | POA: Insufficient documentation

## 2021-08-14 DIAGNOSIS — K640 First degree hemorrhoids: Secondary | ICD-10-CM | POA: Diagnosis not present

## 2021-08-14 DIAGNOSIS — Z1211 Encounter for screening for malignant neoplasm of colon: Secondary | ICD-10-CM | POA: Diagnosis not present

## 2021-08-17 ENCOUNTER — Encounter: Payer: Self-pay | Admitting: Podiatry

## 2021-08-22 ENCOUNTER — Other Ambulatory Visit: Payer: Self-pay | Admitting: Podiatry

## 2021-08-22 MED ORDER — MELOXICAM 15 MG PO TABS: ORAL_TABLET | ORAL | 3 refills | Status: DC

## 2021-09-08 DIAGNOSIS — J069 Acute upper respiratory infection, unspecified: Secondary | ICD-10-CM | POA: Diagnosis not present

## 2021-09-14 DIAGNOSIS — H25813 Combined forms of age-related cataract, bilateral: Secondary | ICD-10-CM | POA: Diagnosis not present

## 2021-09-20 ENCOUNTER — Telehealth: Payer: Self-pay

## 2021-09-20 NOTE — Progress Notes (Signed)
BP (!) 141/76   Pulse 81   Temp 98.5 F (36.9 C) (Oral)   Ht 5\' 2"  (1.575 m)   Wt 172 lb 12.8 oz (78.4 kg)   SpO2 99%   BMI 31.61 kg/m    Subjective:    Patient ID: , female    DOB: 16-Mar-1971, 50 y.o.   MRN: [id number]  HPI: [name] [name] is a 50 y.o. female  Chief Complaint  Patient presents with  . Breast Mass    Left    Patient states she is here because she felt a lump in her breast about 2 weeks ago.  Patient states it is not painful.  Lump is persistent and does not come and go.  Does have a history of cysts.     Relevant past medical, surgical, family and social history reviewed and updated as indicated. Interim medical history since our last visit reviewed. Allergies and medications reviewed and updated.  Review of Systems  Skin:        Left breast lump   Per HPI unless specifically indicated above     Objective:    BP (!) 141/76   Pulse 81   Temp 98.5 F (36.9 C) (Oral)   Ht 5\' 2"  (1.575 m)   Wt 172 lb 12.8 oz (78.4 kg)   SpO2 99%   BMI 31.61 kg/m   Wt Readings from Last 3 Encounters:  09/21/21 172 lb 12.8 oz (78.4 kg)  06/16/21 175 lb (79.4 kg)  06/13/21 181 lb 3.2 oz (82.2 kg)    Physical Exam Vitals and nursing note reviewed. Exam conducted with a chaperone present 06/18/21, CMA).  Constitutional:      General: She is not in acute distress.    Appearance: Normal appearance. She is normal weight. She is not ill-appearing, toxic-appearing or diaphoretic.  HENT:     Head: Normocephalic.     Right Ear: External ear normal.     Left Ear: External ear normal.     Nose: Nose normal.     Mouth/Throat:     Mouth: Mucous membranes are moist.     Pharynx: Oropharynx is clear.  Eyes:     General:        Right eye: No discharge.        Left eye: No discharge.     Extraocular Movements: Extraocular movements intact.     Conjunctiva/sclera: Conjunctivae normal.     Pupils: Pupils are equal, round, and reactive to light.   Cardiovascular:     Rate and Rhythm: Normal rate and regular rhythm.     Heart sounds: No murmur heard. Pulmonary:     Effort: Pulmonary effort is normal. No respiratory distress.     Breath sounds: Normal breath sounds. No wheezing or rales.  Chest:    Musculoskeletal:     Cervical back: Normal range of motion and neck supple.  Skin:    General: Skin is warm and dry.     Capillary Refill: Capillary refill takes less than 2 seconds.  Neurological:     General: No focal deficit present.     Mental Status: She is alert and oriented to person, place, and time. Mental status is at baseline.  Psychiatric:        Mood and Affect: Mood normal.        Behavior: Behavior normal.        Thought Content: Thought content normal.        Judgment: Judgment  normal.    Results for orders placed or performed during the hospital encounter of 04/10/20  SARS CORONAVIRUS 2 (TAT 6-24 HRS) Nasopharyngeal Nasopharyngeal Swab   Specimen: Nasopharyngeal Swab  Result Value Ref Range   SARS Coronavirus 2 POSITIVE (A) NEGATIVE      Assessment & Plan:   Problem List Items Addressed This Visit       Other   Breast lump - Primary   Relevant Orders   MM Digital Diagnostic Unilat L   US BREAST COMPLETE UNI LEFT INC AXILLA     Follow up plan: Return if symptoms worsen or fail to improve.

## 2021-09-20 NOTE — Telephone Encounter (Signed)
Patient did not establish care with [facility].  We sent her there in June because we did not have availability.  We did not have availability again today so I called [facility] and they are able to see her on 09/21/21 at 9am.  The pt is still a pt of [facility].

## 2021-09-20 NOTE — Telephone Encounter (Signed)
Patient has not been seen here since 2017 and has been seen by [facility] as a new patient   Copied from CRM [id number]. Topic: General - Other >> Sep 20, 2021  8:31 AM Jaquita Rector A wrote: Reason for CRM: Patient called in to say that she have had cysts on her breast in the past and have one now needing to be seen before next available appointment on 10/09/21 would prefer to see a female please Ms Suzie Portela if possible please call for an appointment Ph# [phone]

## 2021-09-21 ENCOUNTER — Encounter: Payer: Self-pay | Admitting: Nurse Practitioner

## 2021-09-21 ENCOUNTER — Other Ambulatory Visit: Payer: Self-pay

## 2021-09-21 ENCOUNTER — Ambulatory Visit: Payer: BC Managed Care – PPO | Admitting: Nurse Practitioner

## 2021-09-21 VITALS — BP 141/76 | HR 81 | Temp 98.5°F | Ht 62.0 in | Wt 172.8 lb

## 2021-09-21 DIAGNOSIS — N63 Unspecified lump in unspecified breast: Secondary | ICD-10-CM

## 2021-09-25 DIAGNOSIS — N6325 Unspecified lump in the left breast, overlapping quadrants: Secondary | ICD-10-CM

## 2021-10-03 ENCOUNTER — Ambulatory Visit
Admission: RE | Admit: 2021-10-03 | Discharge: 2021-10-03 | Disposition: A | Discharge status: Home / Self Care | Payer: BC Managed Care – PPO | Source: Ambulatory Visit | Attending: Nurse Practitioner | Admitting: Nurse Practitioner

## 2021-10-03 ENCOUNTER — Other Ambulatory Visit: Payer: Self-pay

## 2021-10-03 DIAGNOSIS — N6325 Unspecified lump in the left breast, overlapping quadrants: Secondary | ICD-10-CM

## 2021-10-03 DIAGNOSIS — R922 Inconclusive mammogram: Secondary | ICD-10-CM | POA: Diagnosis not present

## 2021-10-04 NOTE — Progress Notes (Signed)
Please let patient know that in the left breast there is a benign simple cyst measuring 4.5 cm.  The radiologist would like you to return in 1 year for your routine screening.

## 2021-10-11 DIAGNOSIS — Z01818 Encounter for other preprocedural examination: Secondary | ICD-10-CM | POA: Diagnosis not present

## 2021-10-11 DIAGNOSIS — H25811 Combined forms of age-related cataract, right eye: Secondary | ICD-10-CM | POA: Diagnosis not present

## 2021-10-23 DIAGNOSIS — H25811 Combined forms of age-related cataract, right eye: Secondary | ICD-10-CM | POA: Diagnosis not present

## 2021-11-06 DIAGNOSIS — H25812 Combined forms of age-related cataract, left eye: Secondary | ICD-10-CM | POA: Diagnosis not present

## 2021-11-06 DIAGNOSIS — H52222 Regular astigmatism, left eye: Secondary | ICD-10-CM | POA: Diagnosis not present

## 2021-11-13 DIAGNOSIS — H5212 Myopia, left eye: Secondary | ICD-10-CM | POA: Diagnosis not present

## 2021-12-28 DIAGNOSIS — L578 Other skin changes due to chronic exposure to nonionizing radiation: Secondary | ICD-10-CM | POA: Diagnosis not present

## 2021-12-28 DIAGNOSIS — Z872 Personal history of diseases of the skin and subcutaneous tissue: Secondary | ICD-10-CM | POA: Diagnosis not present

## 2022-01-26 DIAGNOSIS — R232 Flushing: Secondary | ICD-10-CM | POA: Diagnosis not present

## 2022-01-26 DIAGNOSIS — E2839 Other primary ovarian failure: Secondary | ICD-10-CM | POA: Diagnosis not present

## 2022-02-21 DIAGNOSIS — Z1389 Encounter for screening for other disorder: Secondary | ICD-10-CM | POA: Diagnosis not present

## 2022-02-21 DIAGNOSIS — Z0000 Encounter for general adult medical examination without abnormal findings: Secondary | ICD-10-CM | POA: Diagnosis not present

## 2022-02-21 DIAGNOSIS — R0602 Shortness of breath: Secondary | ICD-10-CM | POA: Diagnosis not present

## 2022-02-21 DIAGNOSIS — R079 Chest pain, unspecified: Secondary | ICD-10-CM | POA: Diagnosis not present

## 2022-02-21 DIAGNOSIS — Z131 Encounter for screening for diabetes mellitus: Secondary | ICD-10-CM | POA: Diagnosis not present

## 2022-02-21 DIAGNOSIS — Z13220 Encounter for screening for lipoid disorders: Secondary | ICD-10-CM | POA: Diagnosis not present

## 2022-03-02 DIAGNOSIS — R079 Chest pain, unspecified: Secondary | ICD-10-CM | POA: Diagnosis not present

## 2022-03-02 DIAGNOSIS — R0602 Shortness of breath: Secondary | ICD-10-CM | POA: Diagnosis not present

## 2022-03-21 DIAGNOSIS — R079 Chest pain, unspecified: Secondary | ICD-10-CM | POA: Diagnosis not present

## 2022-03-27 IMAGING — MG MM DIGITAL DIAGNOSTIC UNILAT*L* W/ TOMO W/ CAD
6 series · 6 of 18 positions shown · non-contrast
Comparison: Previous exam(s).

CLINICAL DATA: 49-year-old female presenting with a lump in the
left breast that she feels is enlarging.

EXAM:
DIGITAL DIAGNOSTIC UNILATERAL LEFT MAMMOGRAM WITH TOMOSYNTHESIS AND
CAD; ULTRASOUND LEFT BREAST LIMITED
TECHNIQUE: Left digital diagnostic mammography and breast tomosynthesis was
performed. The images were evaluated with computer-aided detection.;
Targeted ultrasound examination of the left breast was performed.

[L CC synth-2D]
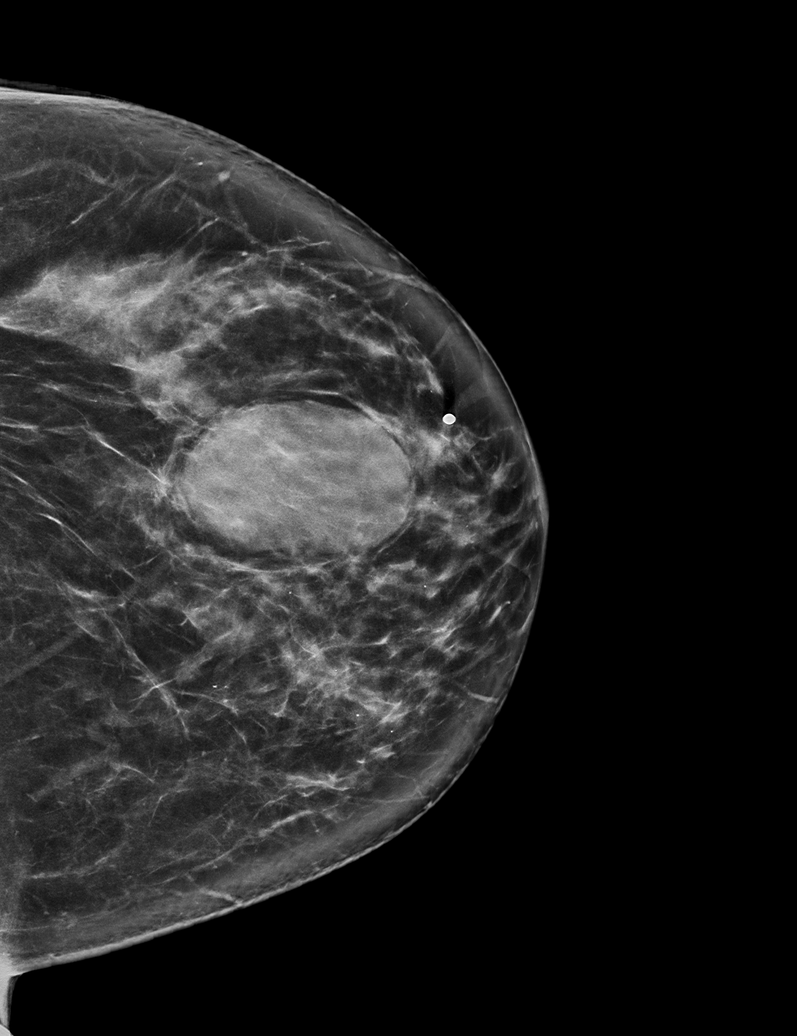

[L MLO synth-2D (1 of 2)]
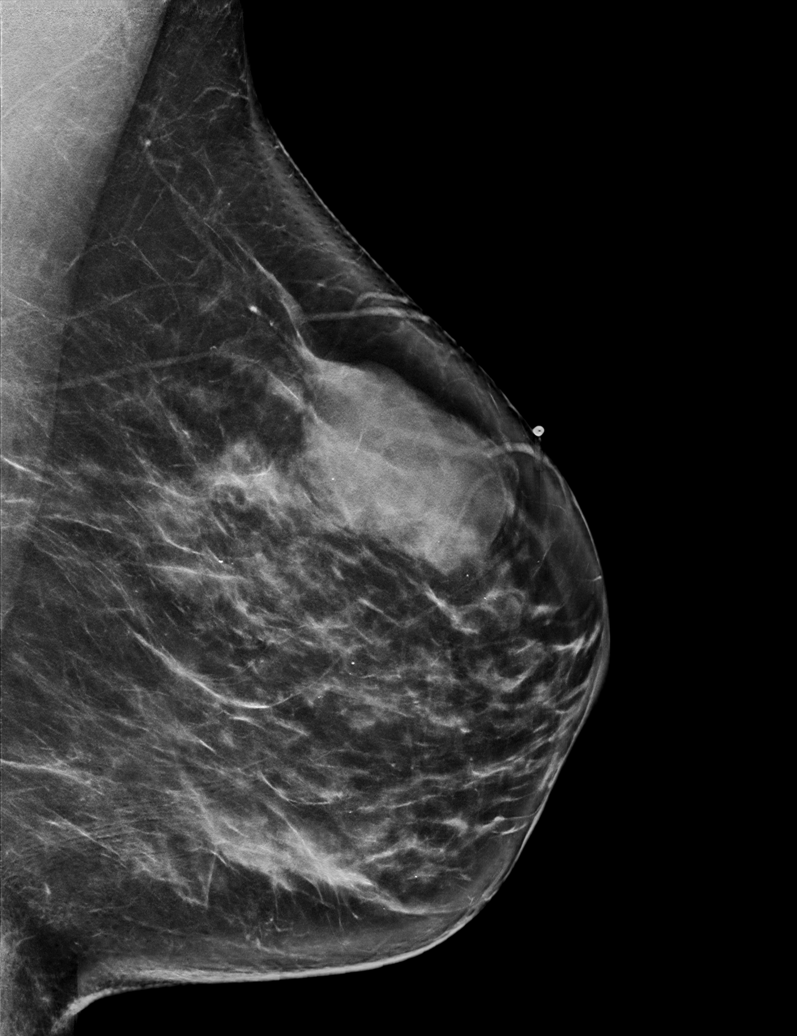

[L MLO synth-2D (2 of 2)]
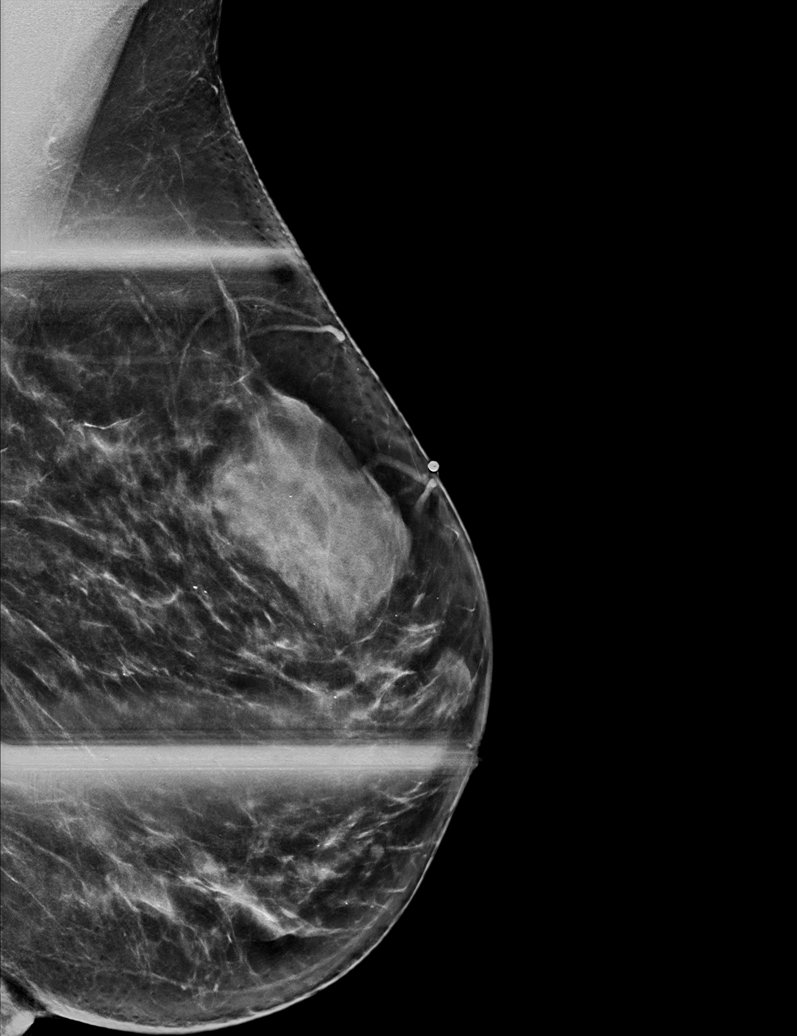

[L MLO tomo (1 of 2) · tomo slice 43/85.0]
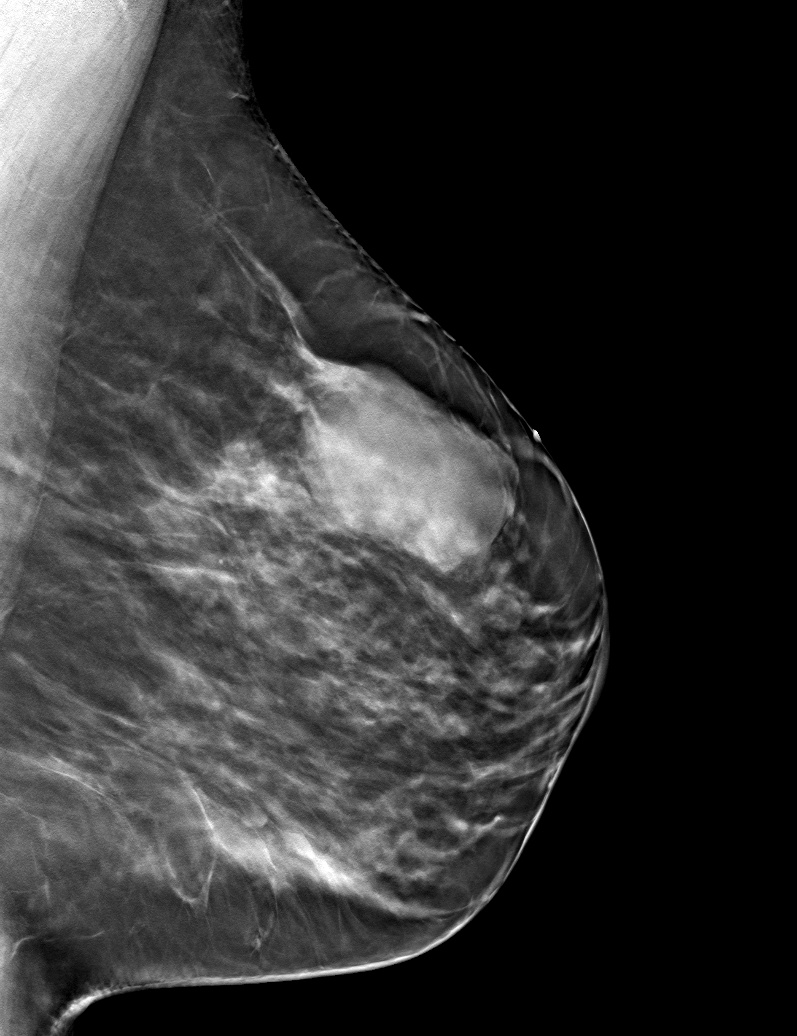

[L CC tomo · tomo slice 39/77.0]
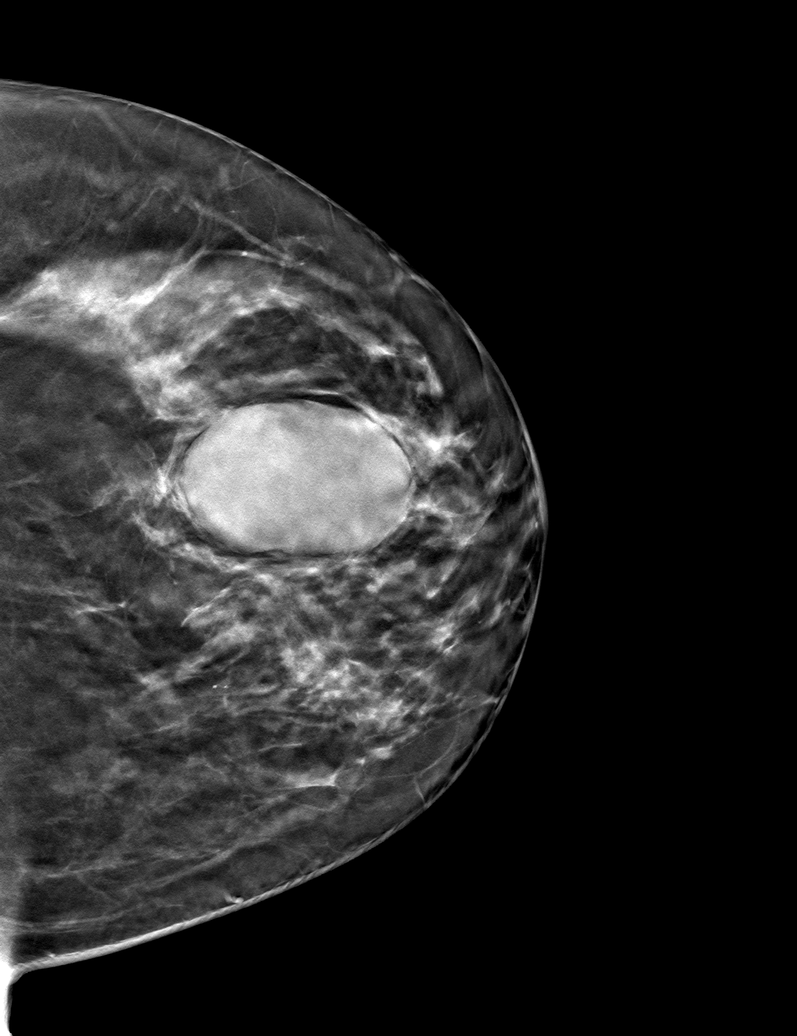

[L MLO tomo (2 of 2) · tomo slice 36/71.0]
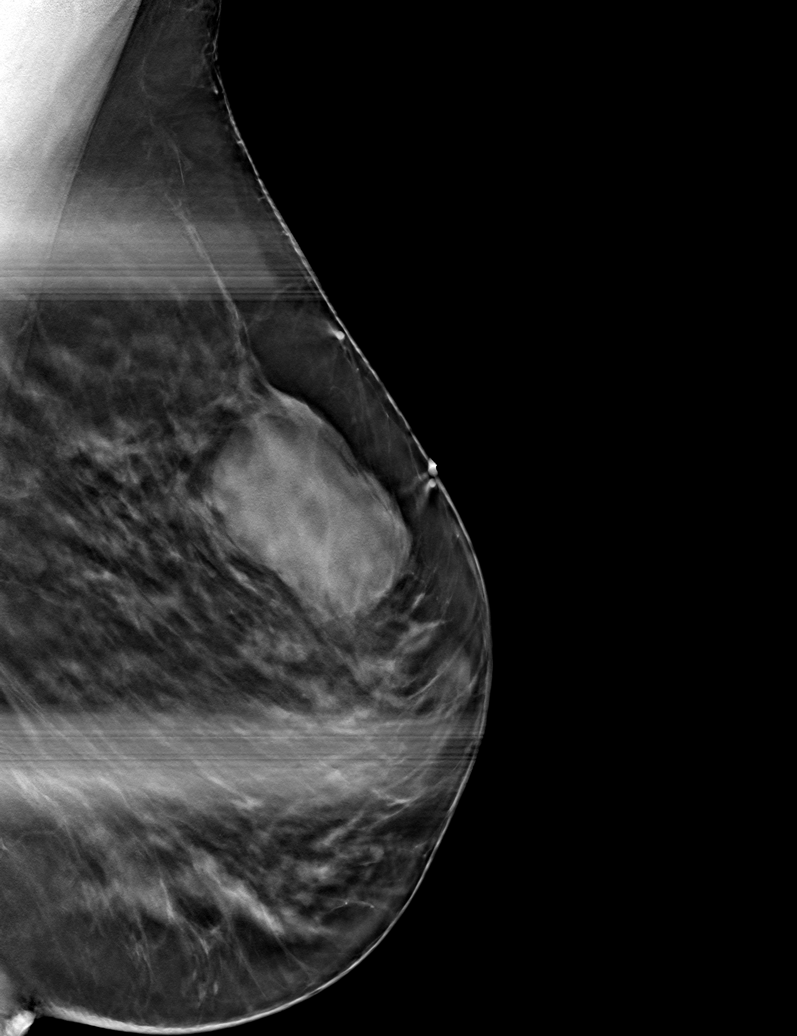

[6 of 18 positions shown; findings below may reference images not displayed]

ACR Breast Density Category c: The breast tissue is heterogeneously
dense, which may obscure small masses.
FINDINGS: Mammogram:

Left breast: A skin BB marks the site of concern reported by the
patient in the upper outer left breast. A spot tangential view of
this area was performed in addition to standard views. At the
palpable site there is an oval circumscribed mass measuring
approximately 4.7 cm.

Ultrasound:

Targeted ultrasound performed in the left breast at the palpable
site at 1 o'clock 3 cm from the nipple demonstrating an oval
circumscribed anechoic mass, consistent with a benign cyst measuring
4.5 x 2.8 x 3.0 cm.
IMPRESSION: At the palpable site of concern in the left breast there is a benign
simple cyst measuring 4.5 cm.

RECOMMENDATION:
Return to routine annual screening mammography which will be due in
July 2022.

I have discussed the findings and recommendations with the patient.
If applicable, a reminder letter will be sent to the patient
regarding the next appointment.

BI-RADS CATEGORY  2: Benign.

## 2022-03-27 IMAGING — US US BREAST*L* LIMITED INC AXILLA
1 series · 6 of 6 positions shown · non-contrast
Comparison: Previous exam(s).

CLINICAL DATA: 49-year-old female presenting with a lump in the
left breast that she feels is enlarging.

EXAM:
DIGITAL DIAGNOSTIC UNILATERAL LEFT MAMMOGRAM WITH TOMOSYNTHESIS AND
CAD; ULTRASOUND LEFT BREAST LIMITED
TECHNIQUE: Left digital diagnostic mammography and breast tomosynthesis was
performed. The images were evaluated with computer-aided detection.;
Targeted ultrasound examination of the left breast was performed.

[Series 1: us breast*left* limited inc axilla · 0.07mm/px · 6 of 6 slices shown]
[im 1/6]
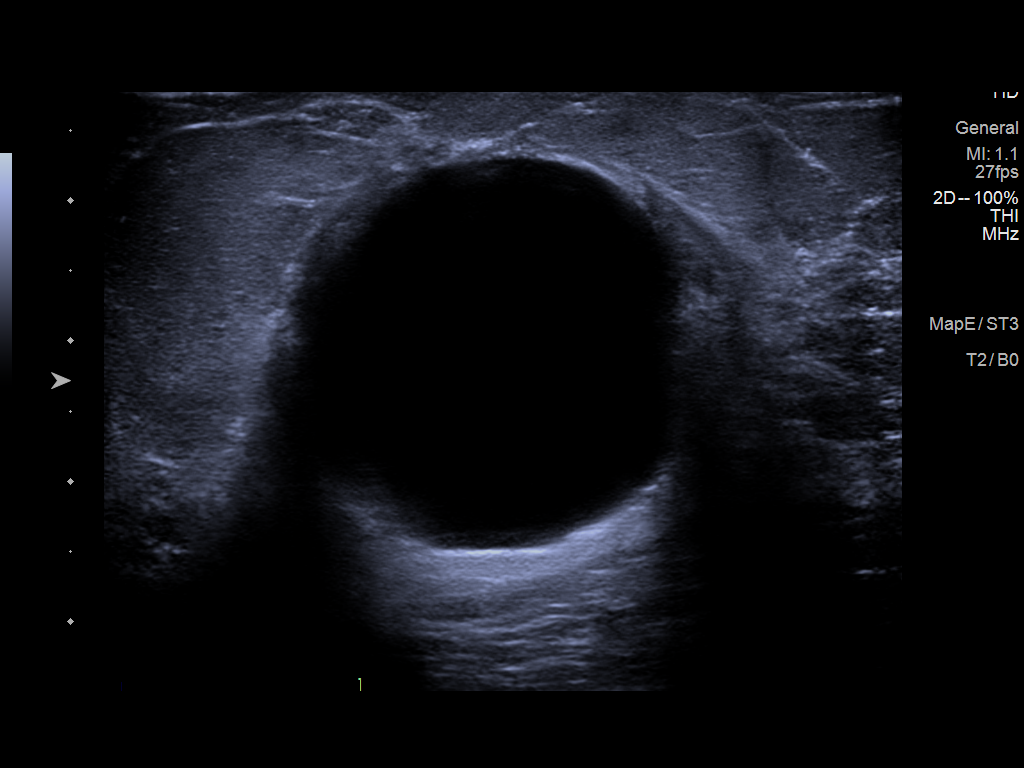
[im 2/6]
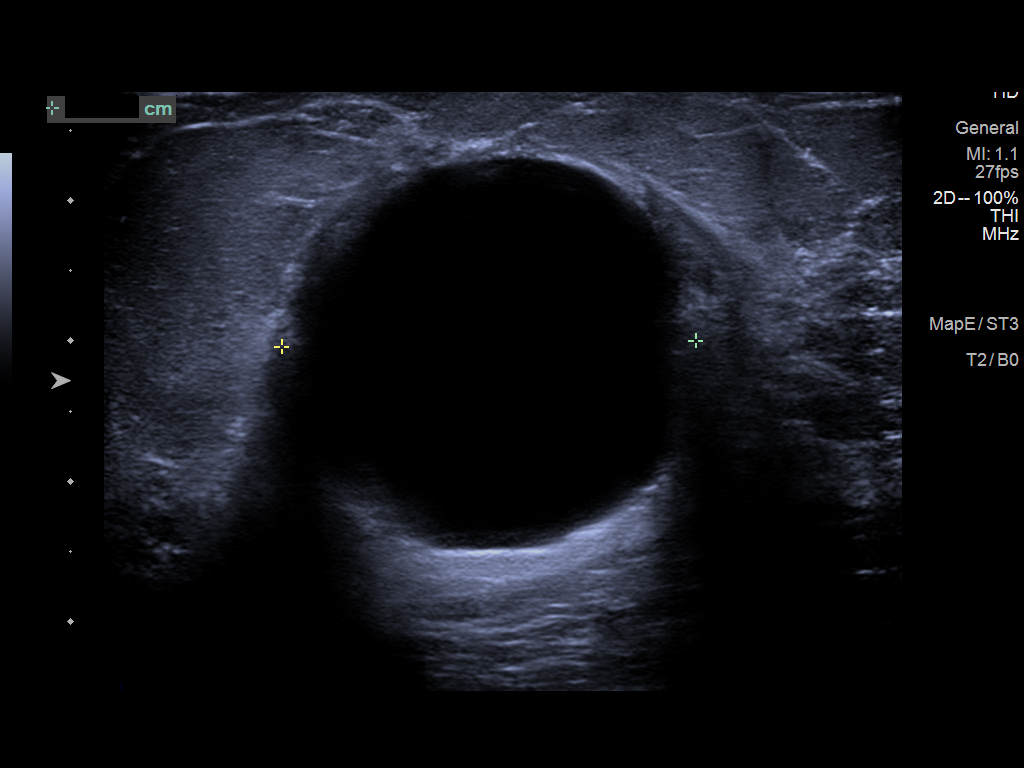
[im 3/6]
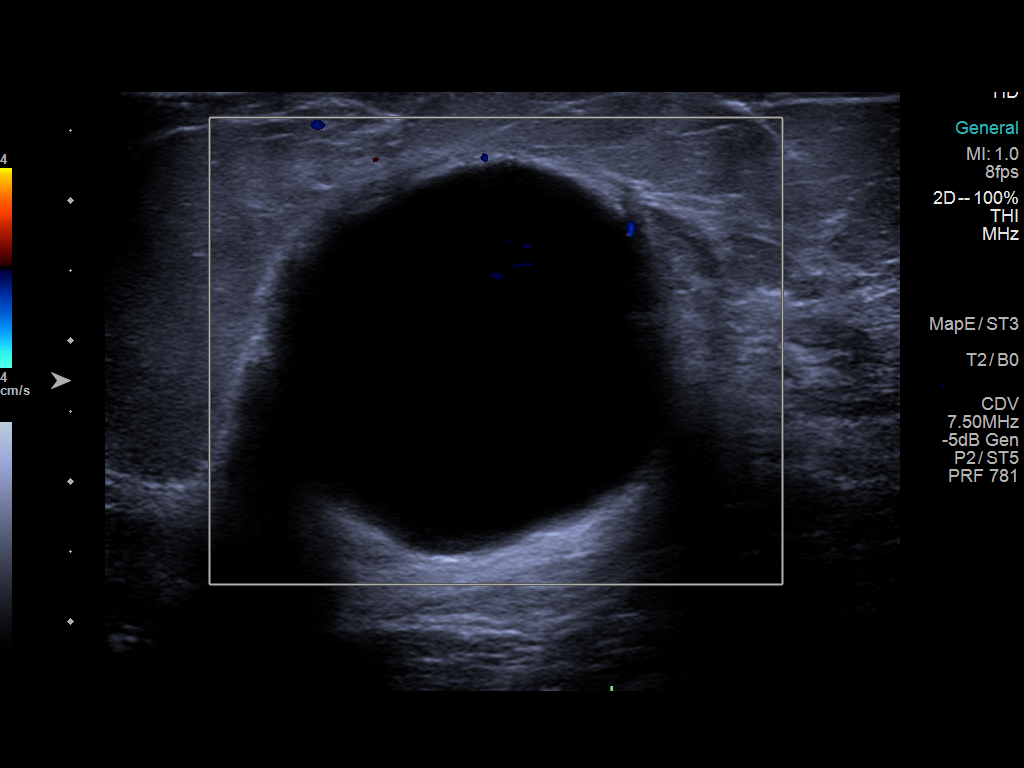
[im 4/6]
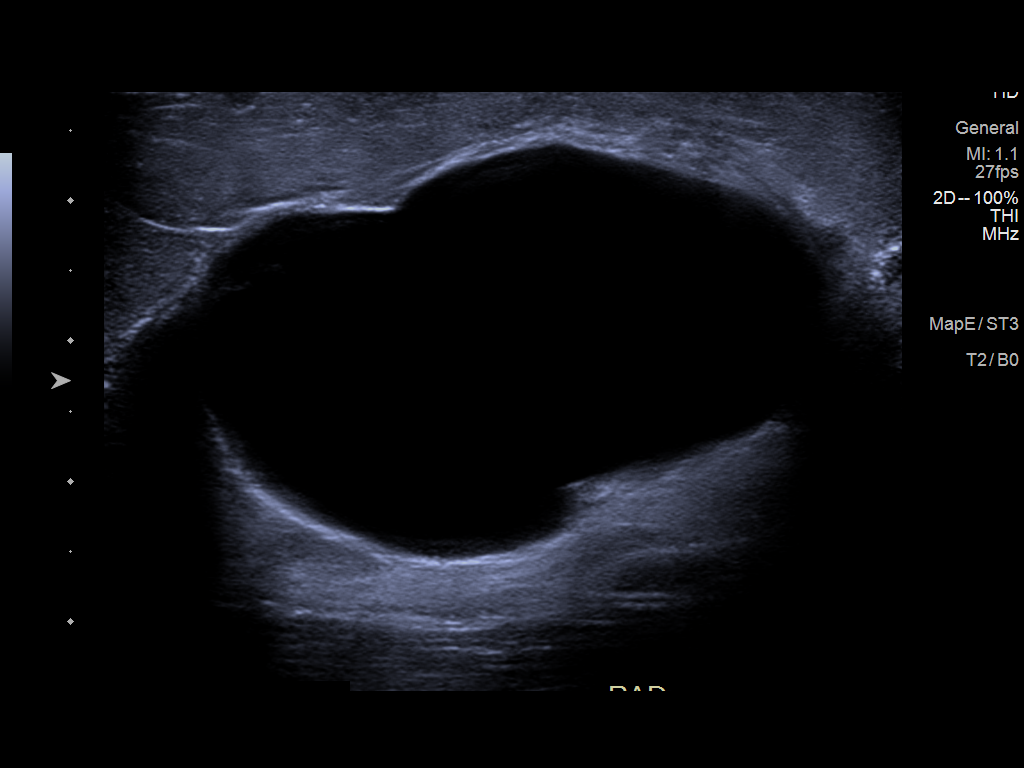
[im 5/6]
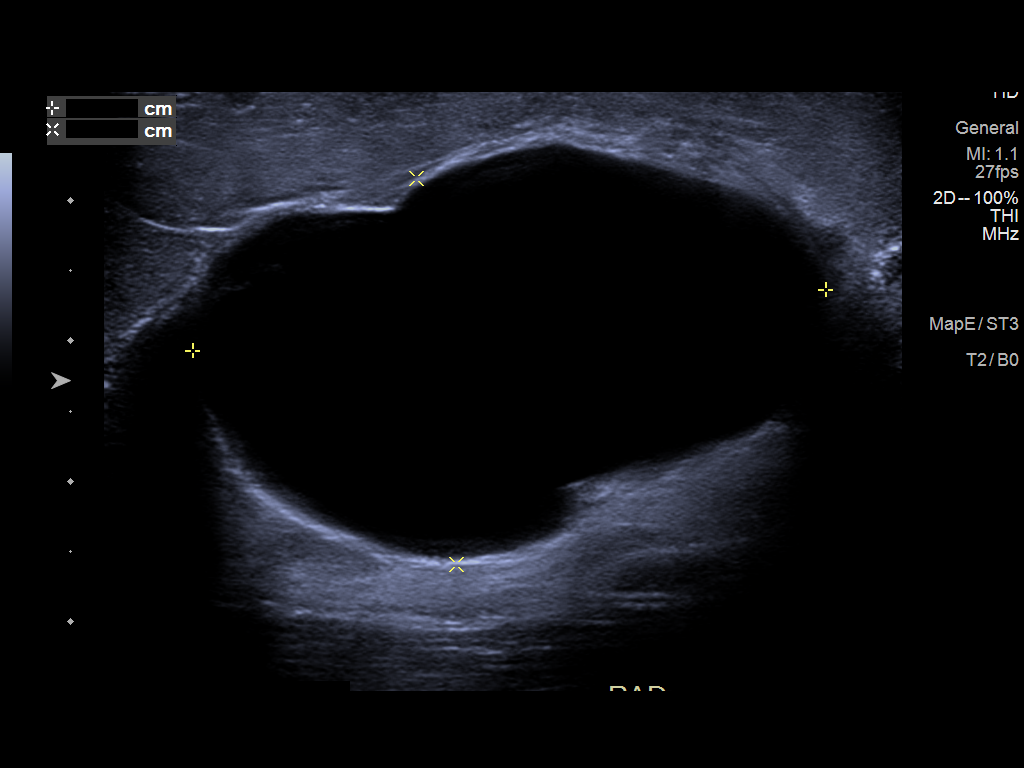
[im 6/6]
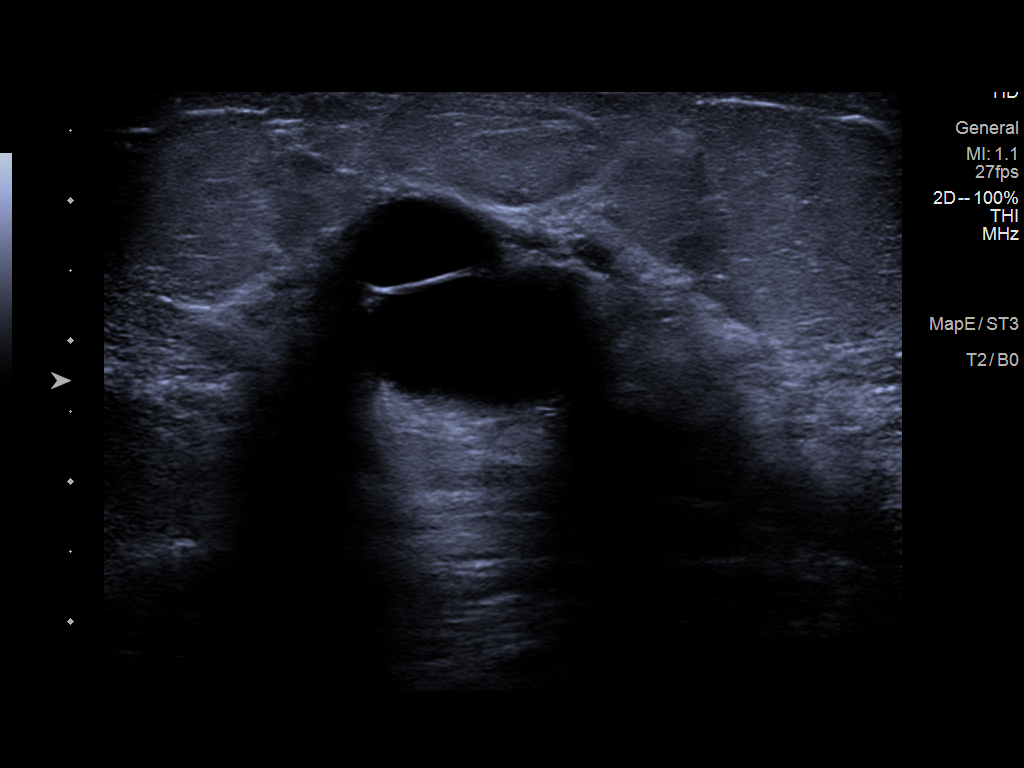

[6 of 6 positions shown; findings below may reference images not displayed]

ACR Breast Density Category c: The breast tissue is heterogeneously
dense, which may obscure small masses.
FINDINGS: Mammogram:

Left breast: A skin BB marks the site of concern reported by the
patient in the upper outer left breast. A spot tangential view of
this area was performed in addition to standard views. At the
palpable site there is an oval circumscribed mass measuring
approximately 4.7 cm.

Ultrasound:

Targeted ultrasound performed in the left breast at the palpable
site at 1 o'clock 3 cm from the nipple demonstrating an oval
circumscribed anechoic mass, consistent with a benign cyst measuring
4.5 x 2.8 x 3.0 cm.
IMPRESSION: At the palpable site of concern in the left breast there is a benign
simple cyst measuring 4.5 cm.

RECOMMENDATION:
Return to routine annual screening mammography which will be due in
July 2022.

I have discussed the findings and recommendations with the patient.
If applicable, a reminder letter will be sent to the patient
regarding the next appointment.

BI-RADS CATEGORY  2: Benign.

## 2022-04-05 DIAGNOSIS — Z1231 Encounter for screening mammogram for malignant neoplasm of breast: Secondary | ICD-10-CM | POA: Diagnosis not present

## 2022-04-05 DIAGNOSIS — Z01419 Encounter for gynecological examination (general) (routine) without abnormal findings: Secondary | ICD-10-CM | POA: Diagnosis not present

## 2022-04-18 ENCOUNTER — Other Ambulatory Visit: Payer: Self-pay | Admitting: Gastroenterology

## 2022-04-18 DIAGNOSIS — R0789 Other chest pain: Secondary | ICD-10-CM | POA: Diagnosis not present

## 2022-04-18 DIAGNOSIS — K449 Diaphragmatic hernia without obstruction or gangrene: Secondary | ICD-10-CM

## 2022-04-18 DIAGNOSIS — K219 Gastro-esophageal reflux disease without esophagitis: Secondary | ICD-10-CM

## 2022-04-18 DIAGNOSIS — R1013 Epigastric pain: Secondary | ICD-10-CM | POA: Diagnosis not present

## 2022-04-25 ENCOUNTER — Ambulatory Visit
Admission: RE | Admit: 2022-04-25 | Discharge: 2022-04-25 | Disposition: A | Discharge status: Home / Self Care | Payer: BC Managed Care – PPO | Source: Ambulatory Visit | Attending: Gastroenterology | Admitting: Gastroenterology

## 2022-04-25 DIAGNOSIS — K449 Diaphragmatic hernia without obstruction or gangrene: Secondary | ICD-10-CM | POA: Diagnosis not present

## 2022-04-25 DIAGNOSIS — K219 Gastro-esophageal reflux disease without esophagitis: Secondary | ICD-10-CM

## 2022-04-30 ENCOUNTER — Other Ambulatory Visit: Payer: BC Managed Care – PPO

## 2022-06-22 ENCOUNTER — Other Ambulatory Visit: Payer: Self-pay | Admitting: Family Medicine

## 2022-06-22 DIAGNOSIS — Z1231 Encounter for screening mammogram for malignant neoplasm of breast: Secondary | ICD-10-CM

## 2022-07-25 ENCOUNTER — Ambulatory Visit
Admission: RE | Admit: 2022-07-25 | Discharge: 2022-07-25 | Disposition: A | Discharge status: Home / Self Care | Payer: BC Managed Care – PPO | Source: Ambulatory Visit | Attending: Family Medicine | Admitting: Family Medicine

## 2022-07-25 DIAGNOSIS — Z1231 Encounter for screening mammogram for malignant neoplasm of breast: Secondary | ICD-10-CM | POA: Diagnosis not present

## 2022-07-27 ENCOUNTER — Other Ambulatory Visit: Payer: Self-pay | Admitting: Family Medicine

## 2022-08-03 ENCOUNTER — Other Ambulatory Visit: Payer: Self-pay | Admitting: Family Medicine

## 2022-08-03 DIAGNOSIS — R928 Other abnormal and inconclusive findings on diagnostic imaging of breast: Secondary | ICD-10-CM

## 2022-08-03 DIAGNOSIS — N63 Unspecified lump in unspecified breast: Secondary | ICD-10-CM

## 2022-08-09 ENCOUNTER — Ambulatory Visit
Admission: RE | Admit: 2022-08-09 | Discharge: 2022-08-09 | Disposition: A | Discharge status: Home / Self Care | Payer: BC Managed Care – PPO | Source: Ambulatory Visit | Attending: Family Medicine | Admitting: Family Medicine

## 2022-08-09 DIAGNOSIS — R928 Other abnormal and inconclusive findings on diagnostic imaging of breast: Secondary | ICD-10-CM

## 2022-08-09 DIAGNOSIS — N63 Unspecified lump in unspecified breast: Secondary | ICD-10-CM | POA: Diagnosis not present

## 2022-08-09 DIAGNOSIS — N6011 Diffuse cystic mastopathy of right breast: Secondary | ICD-10-CM | POA: Diagnosis not present

## 2022-08-09 DIAGNOSIS — N6012 Diffuse cystic mastopathy of left breast: Secondary | ICD-10-CM | POA: Diagnosis not present

## 2022-08-09 DIAGNOSIS — R922 Inconclusive mammogram: Secondary | ICD-10-CM | POA: Diagnosis not present

## 2022-09-14 ENCOUNTER — Telehealth: Payer: Self-pay | Admitting: Podiatry

## 2022-09-14 ENCOUNTER — Other Ambulatory Visit: Payer: Self-pay | Admitting: Podiatry

## 2022-09-14 MED ORDER — MELOXICAM 15 MG PO TABS: ORAL_TABLET | ORAL | 3 refills | Status: DC

## 2022-09-14 NOTE — Telephone Encounter (Signed)
Pt asking For a refill of Rx Meloxicam.  Please advise.

## 2022-09-14 NOTE — Telephone Encounter (Signed)
Refill sent.  Thanks, Dr. Makai Dumond

## 2022-10-16 DIAGNOSIS — K449 Diaphragmatic hernia without obstruction or gangrene: Secondary | ICD-10-CM | POA: Diagnosis not present

## 2022-10-16 DIAGNOSIS — K219 Gastro-esophageal reflux disease without esophagitis: Secondary | ICD-10-CM | POA: Diagnosis not present

## 2022-10-31 ENCOUNTER — Ambulatory Visit: Admit: 2022-10-31 | Payer: BC Managed Care – PPO | Admitting: Gastroenterology

## 2022-10-31 DIAGNOSIS — K449 Diaphragmatic hernia without obstruction or gangrene: Secondary | ICD-10-CM | POA: Diagnosis not present

## 2022-10-31 DIAGNOSIS — K259 Gastric ulcer, unspecified as acute or chronic, without hemorrhage or perforation: Secondary | ICD-10-CM | POA: Diagnosis not present

## 2022-10-31 DIAGNOSIS — K3189 Other diseases of stomach and duodenum: Secondary | ICD-10-CM | POA: Diagnosis not present

## 2022-10-31 DIAGNOSIS — K2950 Unspecified chronic gastritis without bleeding: Secondary | ICD-10-CM | POA: Diagnosis not present

## 2022-10-31 DIAGNOSIS — K317 Polyp of stomach and duodenum: Secondary | ICD-10-CM | POA: Diagnosis not present

## 2022-10-31 SURGERY — EGD (ESOPHAGOGASTRODUODENOSCOPY)
Anesthesia: General

## 2022-12-11 DIAGNOSIS — K449 Diaphragmatic hernia without obstruction or gangrene: Secondary | ICD-10-CM | POA: Diagnosis not present

## 2022-12-11 DIAGNOSIS — K219 Gastro-esophageal reflux disease without esophagitis: Secondary | ICD-10-CM | POA: Diagnosis not present

## 2023-03-19 ENCOUNTER — Ambulatory Visit: Payer: BC Managed Care – PPO | Admitting: Podiatry

## 2023-03-20 ENCOUNTER — Ambulatory Visit (INDEPENDENT_AMBULATORY_CARE_PROVIDER_SITE_OTHER): Payer: BC Managed Care – PPO

## 2023-03-20 ENCOUNTER — Ambulatory Visit: Payer: BC Managed Care – PPO | Admitting: Podiatry

## 2023-03-20 DIAGNOSIS — M2012 Hallux valgus (acquired), left foot: Secondary | ICD-10-CM

## 2023-03-20 DIAGNOSIS — Q66222 Congenital metatarsus adductus, left foot: Secondary | ICD-10-CM | POA: Diagnosis not present

## 2023-03-20 DIAGNOSIS — M19072 Primary osteoarthritis, left ankle and foot: Secondary | ICD-10-CM

## 2023-03-20 MED ORDER — DICLOFENAC SODIUM 1 % EX GEL: 4.0000 g | Freq: Four times a day (QID) | CUTANEOUS | 4 refills | Status: AC

## 2023-03-20 NOTE — Patient Instructions (Signed)
Call [facility] to schedule your MRI at the below locations.  Please allow at least 1 business day after your visit to process the referral.  It may take longer depending on approval from insurance.  Please let me know if you have issues or problems scheduling the MRI   [facility] [phone] [address] [place], [place] [address]  [facility] [phone] [address] , [place] [address]  

## 2023-03-20 NOTE — Progress Notes (Addendum)
  Subjective:  Patient ID: [name] [name], female    DOB: [birth year],  MRN: [id number]  Chief Complaint  Patient presents with   Bunions    Left foot - causing pain across the top of her foot - both feet are "achy" most of the time - started years ago    52 y.o. female presents with the above complaint. History confirmed with patient.  She presents for follow-up on her left foot which has a bunion, the bunion itself is not as painful as the pain in the top of the mid arch  Objective:  Physical Exam: warm, good capillary refill, no trophic changes or ulcerative lesions, normal DP and PT pulses, normal sensory exam, and left foot has hallux valgus deformity with large bunion medially, significant dorsiflexion mobility of the first ray, she has pain and tenderness in the midtarsal joint with palpation manipulation, does not have much of a bunion on the right side.    Radiographs: Multiple views x-ray of the left foot:  She has severe metatarsus adductus deformity with significant joint space narrowing and subchondral sclerosis of the third TMT J Assessment:   1. Hallux valgus of left foot   2. Congenital metatarsus adductus, left foot   3. Osteoarthritis of left midfoot      Plan:  Patient was evaluated and treated and all questions answered.  We reviewed her radiographs and discussed etiology treatment options of hallux valgus deformity metatarsus adductus deformity.  We discussed how this is contributed to her midfoot pain and degenerative changes in the third TMT.  We also discussed how the presence of this makes correction of her hallux valgus forming more difficult.  So far she has tried wider shoes, she has had custom and prefabricated orthoses before, previously was on meloxicam which exacerbated her gastric reflux.  We discussed surgical treatment.  I discussed with her that the arthritis could be treated with tarsometatarsal fusion at the same time could consider correction of the  bunion and metatarsus adductus with fusion and osteotomies.  I recommended a CT to evaluate the remaining joints prior to this.  This has been ordered and she will follow-up me after the CT scan for surgical planning.  In the interim I recommended diclofenac gel and I sent an Rx for.  Use 4 times daily  Return for after CT to review.

## 2023-03-27 ENCOUNTER — Telehealth: Payer: Self-pay | Admitting: Podiatry

## 2023-03-27 NOTE — Telephone Encounter (Signed)
Patient called and stated that she was suppose to have a referral for a CT scan.  Please advise

## 2023-03-27 NOTE — Addendum Note (Signed)
Addended bySherryle Lis, Zikeria Keough R on: 03/27/2023 12:18 PM   Modules accepted: Orders

## 2023-04-05 ENCOUNTER — Encounter: Payer: Self-pay | Admitting: Podiatry

## 2023-04-08 ENCOUNTER — Ambulatory Visit
Admission: RE | Admit: 2023-04-08 | Discharge: 2023-04-08 | Disposition: A | Discharge status: Home / Self Care | Payer: BC Managed Care – PPO | Source: Ambulatory Visit | Attending: Podiatry | Admitting: Podiatry

## 2023-04-08 DIAGNOSIS — M2012 Hallux valgus (acquired), left foot: Secondary | ICD-10-CM

## 2023-04-08 DIAGNOSIS — Q66222 Congenital metatarsus adductus, left foot: Secondary | ICD-10-CM

## 2023-04-08 DIAGNOSIS — M19072 Primary osteoarthritis, left ankle and foot: Secondary | ICD-10-CM

## 2023-04-23 ENCOUNTER — Ambulatory Visit: Payer: BC Managed Care – PPO | Admitting: Podiatry

## 2023-05-01 ENCOUNTER — Ambulatory Visit (INDEPENDENT_AMBULATORY_CARE_PROVIDER_SITE_OTHER): Payer: BC Managed Care – PPO

## 2023-05-01 ENCOUNTER — Ambulatory Visit: Payer: BC Managed Care – PPO | Admitting: Podiatry

## 2023-05-01 DIAGNOSIS — M2141 Flat foot [pes planus] (acquired), right foot: Secondary | ICD-10-CM | POA: Diagnosis not present

## 2023-05-01 DIAGNOSIS — M19071 Primary osteoarthritis, right ankle and foot: Secondary | ICD-10-CM

## 2023-05-01 DIAGNOSIS — M19072 Primary osteoarthritis, left ankle and foot: Secondary | ICD-10-CM

## 2023-05-01 DIAGNOSIS — M2012 Hallux valgus (acquired), left foot: Secondary | ICD-10-CM

## 2023-05-01 DIAGNOSIS — M2142 Flat foot [pes planus] (acquired), left foot: Secondary | ICD-10-CM

## 2023-05-01 DIAGNOSIS — Q66222 Congenital metatarsus adductus, left foot: Secondary | ICD-10-CM

## 2023-05-02 NOTE — Progress Notes (Signed)
Subjective:  Patient ID: [name] [name], female    DOB: Jun 22, 1971,  MRN: [id number]  Chief Complaint  Patient presents with   Bunions    follow up for CT of left foot/ pt upset was she went to [place] 4[place]30 and she was scheduled in [place]. Pt was not aware and did not want to come to [place].dm    52 y.o. female presents with the above complaint. History confirmed with patient.  She returns for follow-up.  She completed the CT scan.  No change in symptoms  Objective:  Physical Exam: warm, good capillary refill, no trophic changes or ulcerative lesions, normal DP and PT pulses, normal sensory exam, and left foot has hallux valgus deformity with large bunion medially, significant dorsiflexion mobility of the first ray, she has pain and tenderness in the midtarsal joint with palpation manipulation, does not have much of a bunion on the right side.    Radiographs: Multiple views x-ray of the left foot:  She has severe metatarsus adductus deformity with significant joint space narrowing and subchondral sclerosis of the third TMT J  Right foot radiographs taken today show mild hallux valgus deformity with increase in intermetatarsal angle, minimal metatarsus adductus and pes planus  Study Result  Narrative & Impression  CLINICAL DATA:  Chronic foot pain.  Preoperative planning.   EXAM: CT OF THE LEFT FOOT WITHOUT CONTRAST   TECHNIQUE: Multidetector CT imaging of the left foot was performed according to the standard protocol. Multiplanar CT image reconstructions were also generated.   RADIATION DOSE REDUCTION: This exam was performed according to the departmental dose-optimization program which includes automated exposure control, adjustment of the mA and/or kV according to patient size and/or use of iterative reconstruction technique.   COMPARISON:  None Available.   FINDINGS: Bones/Joint/Cartilage   No fracture or dislocation. Hallux valgus.  No joint effusion.   Mild  osteoarthritis of the first MTP joint. Moderate arthritic changes of the medial hallux sesamoid-metatarsal articulation. Periarticular erosion along the dorsal aspect of the first metacarpal head as can be seen with a crystalline or inflammatory arthropathy.   Mild osteoarthritis of the second TMT joint. Moderate osteoarthritis of the third TMT joint. Tibiotalar joint is normal.   Ligaments   Ligaments are suboptimally evaluated by CT.   Muscles and Tendons Muscles are normal. No muscle atrophy. No intramuscular fluid collection or hematoma. Flexor, extensor, peroneal and Achilles tendons are intact.   Soft tissue No fluid collection or hematoma.  No soft tissue mass.   IMPRESSION: 1. Hallux valgus with mild osteoarthritis of the first MTP joint. Periarticular erosion along the dorsal aspect of the first metacarpal head as can be seen with a crystalline or inflammatory arthropathy. 2. Mild osteoarthritis of the second TMT joint. 3. Moderate osteoarthritis of the third TMT joint. 4. Moderate arthritic changes of the medial hallux sesamoid-metatarsal articulation.     Electronically Signed   By: Elige Ko M.D.   On: 04/11/2023 11:43   Assessment:   1. Arthritis of right foot   2. Hallux valgus of left foot   3. Congenital metatarsus adductus, left foot   4. Osteoarthritis of left midfoot   5. Pes planus of both feet      Plan:  Patient was evaluated and treated and all questions answered.  We reviewed her right foot radiographs today as well as her left foot CT scan that was completed.  She does have severe osteoarthrosis of the midtarsal joint of the TMT second and third.  There is mild osteoarthritis of the first MTPJ.  The metatarsus adductus deformity is quite severe.  I discussed with her surgically I would recommend arthrodesis of the second third and first tarsometatarsal joints with Lapidus bunionectomy and corrective osteotomy as part of the fusion.  May  require further osteotomy of the fourth metatarsal as a possibility to have room to move over 1 2 and 3.  Her right foot radiographs show mild hallux valgus deformity with minimal arthritic changes.  She will consider her options we discussed the recovery process and the period required of nonweightbearing and gradual restricted weightbearing in a boot from 6 weeks and then 4 weeks in a boot.  Also discussed the possible need for bone grafting.  She will let me know when she is ready for surgery.  She has a surgery benefits plan possible that she may check into this to let me know if she would like a second opinion surgeon to complete this for insurance purposes.  Advised call or message if questions or concerns or if I can help her in any way.  Return for when ready to schedule surgery .

## 2023-05-15 ENCOUNTER — Ambulatory Visit: Payer: BC Managed Care – PPO | Admitting: Podiatry

## 2023-05-15 DIAGNOSIS — Q66222 Congenital metatarsus adductus, left foot: Secondary | ICD-10-CM | POA: Diagnosis not present

## 2023-05-15 DIAGNOSIS — M2012 Hallux valgus (acquired), left foot: Secondary | ICD-10-CM | POA: Diagnosis not present

## 2023-05-15 NOTE — Progress Notes (Signed)
Subjective:  Patient ID: [name] [name], female    DOB: 02/14/1971,  MRN: [id number]  Chief Complaint  Patient presents with   Bunions    CONSULTATION FOR SURGERY LEFT FOOT    52 y.o. female presents with the above complaint. History confirmed with patient.  She returns for follow-up and is ready to schedule surgery, and the surgery plus plan was going to be too difficult to do, she has new insurance with a lower deductible that is starting July 1  Objective:  Physical Exam: warm, good capillary refill, no trophic changes or ulcerative lesions, normal DP and PT pulses, normal sensory exam, and left foot has hallux valgus deformity with large bunion medially, significant dorsiflexion mobility of the first ray, she has pain and tenderness in the midtarsal joint with palpation manipulation, does not have much of a bunion on the right side.    Radiographs: Multiple views x-ray of the left foot:  She has severe metatarsus adductus deformity with significant joint space narrowing and subchondral sclerosis of the third TMT J  Right foot radiographs taken today show mild hallux valgus deformity with increase in intermetatarsal angle, minimal metatarsus adductus and pes planus  Study Result  Narrative & Impression  CLINICAL DATA:  Chronic foot pain.  Preoperative planning.   EXAM: CT OF THE LEFT FOOT WITHOUT CONTRAST   TECHNIQUE: Multidetector CT imaging of the left foot was performed according to the standard protocol. Multiplanar CT image reconstructions were also generated.   RADIATION DOSE REDUCTION: This exam was performed according to the departmental dose-optimization program which includes automated exposure control, adjustment of the mA and/or kV according to patient size and/or use of iterative reconstruction technique.   COMPARISON:  None Available.   FINDINGS: Bones/Joint/Cartilage   No fracture or dislocation. Hallux valgus.  No joint effusion.   Mild  osteoarthritis of the first MTP joint. Moderate arthritic changes of the medial hallux sesamoid-metatarsal articulation. Periarticular erosion along the dorsal aspect of the first metacarpal head as can be seen with a crystalline or inflammatory arthropathy.   Mild osteoarthritis of the second TMT joint. Moderate osteoarthritis of the third TMT joint. Tibiotalar joint is normal.   Ligaments   Ligaments are suboptimally evaluated by CT.   Muscles and Tendons Muscles are normal. No muscle atrophy. No intramuscular fluid collection or hematoma. Flexor, extensor, peroneal and Achilles tendons are intact.   Soft tissue No fluid collection or hematoma.  No soft tissue mass.   IMPRESSION: 1. Hallux valgus with mild osteoarthritis of the first MTP joint. Periarticular erosion along the dorsal aspect of the first metacarpal head as can be seen with a crystalline or inflammatory arthropathy. 2. Mild osteoarthritis of the second TMT joint. 3. Moderate osteoarthritis of the third TMT joint. 4. Moderate arthritic changes of the medial hallux sesamoid-metatarsal articulation.     Electronically Signed   By: Elige Ko M.D.   On: 04/11/2023 11:43   Assessment:   1. Hallux valgus of left foot   2. Congenital metatarsus adductus, left foot      Plan:  Patient was evaluated and treated and all questions answered.  We again discussed her prior progress and surgical plan.  She does have severe osteoarthrosis of the midtarsal joint of the TMT second and third.  There is mild osteoarthritis of the first MTPJ.  The metatarsus adductus deformity is quite severe.  I discussed with her surgically I would recommend arthrodesis of the second third and first tarsometatarsal joints with Lapidus bunionectomy  and corrective osteotomy as part of the fusion.  May require further osteotomy of the fourth metatarsal as a possibility to have room to move over 1 2 and 3.  We discussed the risk benefits and  potential complications of the procedure including but limited to  pain, swelling, infection, scar, numbness which may be temporary or permanent, chronic pain, stiffness, nerve pain or damage, wound healing problems, bone healing problems including delayed or non-union.  We also discussed the need for autograft from the heel.  All questions addressed.  Informed consent signed and reviewed.  Order for knee scooter placed and she will plan for surgery later this summer or early fall   Surgical plan:  Procedure: -Left foot bunion correction with Lapidus procedure, corrective arthrodesis with osteotomy of the second and third TMT, possible extra-articular osteotomy of the fourth metatarsal, bone graft from heel  Location: -[facility]  Anesthesia plan: -IV sedation with regional block  Postoperative pain plan: -Tylenol 1000 mg every 6 hours, ibuprofen 600 mg every 6 hours, gabapentin 300 mg every 8 hours x5 days, oxycodone 5 mg 1-2 tabs every 6 hours only as needed  DVT prophylaxis: -None required  WB Restrictions / DME needs: -NWB in knee scooter postop  No follow-ups on file.

## 2023-05-28 ENCOUNTER — Encounter: Payer: Self-pay | Admitting: Podiatry

## 2023-05-28 DIAGNOSIS — R2689 Other abnormalities of gait and mobility: Secondary | ICD-10-CM

## 2023-07-17 ENCOUNTER — Other Ambulatory Visit: Payer: Self-pay | Admitting: Family Medicine

## 2023-07-17 DIAGNOSIS — Z1231 Encounter for screening mammogram for malignant neoplasm of breast: Secondary | ICD-10-CM

## 2023-07-23 ENCOUNTER — Telehealth: Payer: Self-pay | Admitting: Urology

## 2023-07-23 NOTE — Telephone Encounter (Signed)
DOS - 08/23/23  ARTHRODESIS WITH OSTEOTOMY 2,3 LEFT --- 16109 LAPIDUS PROCEDURE INCLUDING BUNIONECTOMY LEFT --- 60454 BONE GRAFT LEFT X'S 2 --- 20900 METATARSAL OSTEOTOMY 4TH LEFT --- 09811  [employer] - 12/24/22  SPOKE WITH Ezequiel Essex B. WITH [employer] AND SHE STATED THAT FOR CPT CODES 91478, [phone], 20900 AND [phone] NO PRIOR AUTH IS REQUIRED.  CALL REF # [id number]. 07/23/23 AT 4:26 PM EST

## 2023-08-09 ENCOUNTER — Ambulatory Visit: Admission: RE | Admit: 2023-08-09 | Payer: BC Managed Care – PPO | Source: Ambulatory Visit

## 2023-08-09 DIAGNOSIS — Z1231 Encounter for screening mammogram for malignant neoplasm of breast: Secondary | ICD-10-CM | POA: Diagnosis not present

## 2023-08-23 ENCOUNTER — Other Ambulatory Visit: Payer: Self-pay | Admitting: Podiatry

## 2023-08-23 DIAGNOSIS — M2012 Hallux valgus (acquired), left foot: Secondary | ICD-10-CM | POA: Diagnosis not present

## 2023-08-23 DIAGNOSIS — M89722 Major osseous defect, left humerus: Secondary | ICD-10-CM | POA: Diagnosis not present

## 2023-08-23 DIAGNOSIS — Q66222 Congenital metatarsus adductus, left foot: Secondary | ICD-10-CM | POA: Diagnosis not present

## 2023-08-23 MED ORDER — OXYCODONE HCL 5 MG PO TABS: 5.0000 mg | ORAL_TABLET | ORAL | 0 refills | Status: AC | PRN

## 2023-08-23 MED ORDER — GABAPENTIN 300 MG PO CAPS: 300.0000 mg | ORAL_CAPSULE | Freq: Three times a day (TID) | ORAL | 0 refills | Status: DC

## 2023-08-23 MED ORDER — RIVAROXABAN 10 MG PO TABS: 10.0000 mg | ORAL_TABLET | Freq: Every day | ORAL | 0 refills | Status: DC

## 2023-08-23 MED ORDER — ACETAMINOPHEN 500 MG PO TABS: 1000.0000 mg | ORAL_TABLET | Freq: Four times a day (QID) | ORAL | 0 refills | Status: AC | PRN

## 2023-08-23 NOTE — Progress Notes (Unsigned)
08/23/23 Met adductus correction

## 2023-08-29 ENCOUNTER — Ambulatory Visit (INDEPENDENT_AMBULATORY_CARE_PROVIDER_SITE_OTHER): Payer: BC Managed Care – PPO

## 2023-08-29 ENCOUNTER — Ambulatory Visit (INDEPENDENT_AMBULATORY_CARE_PROVIDER_SITE_OTHER): Payer: BC Managed Care – PPO | Admitting: Podiatry

## 2023-08-29 DIAGNOSIS — Z9889 Other specified postprocedural states: Secondary | ICD-10-CM

## 2023-08-29 DIAGNOSIS — M2012 Hallux valgus (acquired), left foot: Secondary | ICD-10-CM

## 2023-08-29 DIAGNOSIS — Q66222 Congenital metatarsus adductus, left foot: Secondary | ICD-10-CM

## 2023-08-29 NOTE — Progress Notes (Signed)
Subjective:  Patient ID: [name] [name], female    DOB: Nov 29, 1971,  MRN: [id number]  Chief Complaint  Patient presents with   Routine Post Op    POV # 1 DOS 08/23/23 --- LEFT FOOT BUNION CORRECTION, MET ADDUCTUS CORRECTION WITH JOINT FUSIONS AND BONE CUTS BONE GRAFT FROM HEEL - [facility] PT    DOS: 08/23/2023 Procedure: Left Lapidus bunionectomy with adductor plasty  52 y.o. female returns for post-op check.  Patient states that she is doing well.  Pain is controlled.  She is known to Dr. Abbott Pao bandages clean dry and intact nonweightbearing to the left lower extremity knee scooter  Review of Systems: Negative except as noted in the HPI. Denies N/V/F/Ch.  Past Medical History:  Diagnosis Date   GERD (gastroesophageal reflux disease)    History of 2019 novel coronavirus disease (COVID-19) 04/10/2020   Motion sickness    roller coasters   Wears contact lenses     Current Outpatient Medications:    acetaminophen (TYLENOL) 500 MG tablet, Take 2 tablets (1,000 mg total) by mouth every 6 (six) hours as needed for up to 14 days (pain)., Disp: 112 tablet, Rfl: 0   dexlansoprazole (DEXILANT) 60 MG capsule, Take 1 capsule (60 mg total) by mouth daily. **PLEASE SCHEDULE FOLLOW UP APPT**, Disp: 90 capsule, Rfl: 0   diclofenac Sodium (VOLTAREN) 1 % GEL, Apply 4 g topically 4 (four) times daily., Disp: 100 g, Rfl: 4   estradiol (VIVELLE-DOT) 0.0375 MG/24HR, Place onto the skin., Disp: , Rfl:    famotidine (PEPCID) 40 MG tablet, Take 1 tablet by mouth at bedtime., Disp: , Rfl:    gabapentin (NEURONTIN) 300 MG capsule, Take 1 capsule (300 mg total) by mouth 3 (three) times daily for 7 days., Disp: 21 capsule, Rfl: 0   Homeopathic Products (ALLERGY MEDICINE PO), Take by mouth., Disp: , Rfl:    levonorgestrel (MIRENA) 20 MCG/24HR IUD, by Intrauterine route., Disp: , Rfl:    meloxicam (MOBIC) 15 MG tablet, TAKE 1 TABLET(15 MG) BY MOUTH DAILY, Disp: 90 tablet, Rfl: 3   MULTIPLE VITAMIN PO, Take by  mouth., Disp: , Rfl:    oxyCODONE (OXY IR/ROXICODONE) 5 MG immediate release tablet, Take 1 tablet (5 mg total) by mouth every 4 (four) hours as needed for up to 7 days for severe pain., Disp: 20 tablet, Rfl: 0   predniSONE (DELTASONE) 10 MG tablet, Take 6 tablets by mouth today and decrease by 1 tablet daily until complete, Disp: 21 tablet, Rfl: 0   Probiotic Product (PROBIOTIC DAILY PO), Take by mouth., Disp: , Rfl:    RABEprazole (ACIPHEX) 20 MG tablet, Take by mouth., Disp: , Rfl:    rivaroxaban (XARELTO) 10 MG TABS tablet, Take 1 tablet (10 mg total) by mouth daily., Disp: 30 tablet, Rfl: 0  Social History   Tobacco Use  Smoking Status Never  Smokeless Tobacco Never    Allergies  Allergen Reactions   Sulfa Antibiotics Rash and Itching   Objective:  There were no vitals filed for this visit. There is no height or weight on file to calculate BMI. Constitutional Well developed. Well nourished.  Vascular Foot warm and well perfused. Capillary refill normal to all digits.   Neurologic Normal speech. Oriented to person, place, and time. Epicritic sensation to light touch grossly present bilaterally.  Dermatologic Skin healing well without signs of infection. Skin edges well coapted without signs of infection.  Orthopedic: Tenderness to palpation noted about the surgical site.   Radiographs: 3 views  of skeletally mature left foot: Good correction alignment noted hardware is intact no signs of backing or loosening noted.  Reduction of bunion deformity noted Assessment:   1. Hallux valgus of left foot   2. Congenital metatarsus adductus, left foot   3. Status post foot surgery    Plan:  Patient was evaluated and treated and all questions answered.  S/p foot surgery left -Progressing as expected post-operatively. -XR: See above -WB Status: Nonweightbearing in knee scooter -Sutures: Intact.  No clinical signs of Deis is noted no complication noted -Medications: None -Foot  redressed.  No follow-ups on file.

## 2023-09-11 ENCOUNTER — Encounter: Payer: Self-pay | Admitting: Podiatry

## 2023-09-11 ENCOUNTER — Ambulatory Visit (INDEPENDENT_AMBULATORY_CARE_PROVIDER_SITE_OTHER): Payer: BC Managed Care – PPO | Admitting: Podiatry

## 2023-09-11 VITALS — BP 150/76 | HR 75

## 2023-09-11 DIAGNOSIS — M2012 Hallux valgus (acquired), left foot: Secondary | ICD-10-CM

## 2023-09-11 DIAGNOSIS — Q66222 Congenital metatarsus adductus, left foot: Secondary | ICD-10-CM

## 2023-09-11 NOTE — Progress Notes (Signed)
Subjective:  Patient ID: [name] [name], female    DOB: Apr 12, 1971,  MRN: [id number]  Chief Complaint  Patient presents with   Routine Post Op    "It's doing good, considering."    DOS: 08/23/2023 Procedure: Left foot multiple midfoot fusions with correction of bunionectomy and osteotomy of metatarsal  52 y.o. female returns for post-op check.  Pain is improving, no longer having to take the oxycodone  Review of Systems: Negative except as noted in the HPI. Denies N/V/F/Ch.   Objective:   Vitals:   09/11/23 0815  BP: (!) 150/76  Pulse: 75   There is no height or weight on file to calculate BMI. Constitutional Well developed. Well nourished.  Vascular Foot warm and well perfused. Capillary refill normal to all digits.  Calf is soft and supple, no posterior calf or knee pain, negative Homans' sign  Neurologic Normal speech. Oriented to person, place, and time. Epicritic sensation to light touch grossly present bilaterally.  Dermatologic Incisions are well-healed not hypertrophic.  Medial incision has slight superficial delayed healing  Orthopedic: Edema improved   Multiple view plain film radiographs: Radiographs taken on 08/29/2023 show good correction no complication of hardware Assessment:   1. Hallux valgus of left foot   2. Congenital metatarsus adductus, left foot    Plan:  Patient was evaluated and treated and all questions answered.  S/p foot surgery left -Progressing as expected post-operatively. -WB Status: NWB in cam walker boot -Sutures: Removed today, may wash foot at this point. -Medications: No refills required -Neosporin to area of delayed healing with Band-Aid as needed -May begin gradual weightbearing in cam boot on October 11.  I will see her the following week.  If pain is controlled she may return to work as tolerated when she is ready if she is able to maintain nonweightbearing and elevation of the foot  Return in about 4 weeks (around  10/09/2023) for post op (new x-rays).

## 2023-09-24 ENCOUNTER — Encounter: Payer: Self-pay | Admitting: Podiatry

## 2023-10-02 ENCOUNTER — Encounter: Payer: BC Managed Care – PPO | Admitting: Podiatry

## 2023-10-09 ENCOUNTER — Encounter: Payer: Self-pay | Admitting: Podiatry

## 2023-10-09 ENCOUNTER — Ambulatory Visit (INDEPENDENT_AMBULATORY_CARE_PROVIDER_SITE_OTHER): Payer: BC Managed Care – PPO | Admitting: Podiatry

## 2023-10-09 ENCOUNTER — Ambulatory Visit (INDEPENDENT_AMBULATORY_CARE_PROVIDER_SITE_OTHER): Payer: BC Managed Care – PPO

## 2023-10-09 VITALS — BP 150/76 | HR 70

## 2023-10-09 DIAGNOSIS — Z9889 Other specified postprocedural states: Secondary | ICD-10-CM

## 2023-10-09 NOTE — Progress Notes (Signed)
  Subjective:  Patient ID: [name] [name], female    DOB: 1971/03/18,  MRN: [id number]  Chief Complaint  Patient presents with   Routine Post Op    POV # 3 DOS 08/23/23 --- LEFT FOOT BUNION CORRECTION, MET ADDUCTUS CORRECTION WITH JOINT FUSIONS AND BONE CUTS BONE GRAFT FROM HEEL "Okay, I guess.  It's still sore when I walk on it."    DOS: 08/23/2023 Procedure: Left foot multiple midfoot fusions with correction of bunionectomy and osteotomy of metatarsal  52 y.o. female returns for post-op check.  She is doing well is still swollen but the pain is much better  Review of Systems: Negative except as noted in the HPI. Denies N/V/F/Ch.   Objective:   Vitals:   10/09/23 1342  BP: (!) 150/76  Pulse: 70   There is no height or weight on file to calculate BMI. Constitutional Well developed. Well nourished.  Vascular Foot warm and well perfused. Capillary refill normal to all digits.  Calf is soft and supple, no posterior calf or knee pain, negative Homans' sign  Neurologic Normal speech. Oriented to person, place, and time. Epicritic sensation to light touch grossly present bilaterally.  Dermatologic Incisions are well-healed  Orthopedic: Edema improved but still present   Multiple view plain film radiographs: New films taken today show good early consolidation across fusion sites Assessment:   1. Status post foot surgery    Plan:  Patient was evaluated and treated and all questions answered.  S/p foot surgery left -Overall doing well, she is now 6-1/2 weeks out from surgery she has began gradual weightbearing in a cam boot.  She may gradually increase her weightbearing over the next few weeks and around November 1 may begin gradual transition back to supportive shoe gear such as a sneaker or boot.  I will see her back a few weeks after that for final radiographs.  Compression sleeve applied today to reduce edema, emphasized continuing to ice and elevate at the end of the  day  Return in about 6 weeks (around 11/18/2023) for post op (new x-rays).

## 2023-11-18 ENCOUNTER — Encounter: Payer: Self-pay | Admitting: Podiatry

## 2023-11-18 ENCOUNTER — Ambulatory Visit (INDEPENDENT_AMBULATORY_CARE_PROVIDER_SITE_OTHER): Payer: BC Managed Care – PPO

## 2023-11-18 ENCOUNTER — Ambulatory Visit (INDEPENDENT_AMBULATORY_CARE_PROVIDER_SITE_OTHER): Payer: BC Managed Care – PPO | Admitting: Podiatry

## 2023-11-18 DIAGNOSIS — Z9889 Other specified postprocedural states: Secondary | ICD-10-CM

## 2023-11-18 DIAGNOSIS — M2012 Hallux valgus (acquired), left foot: Secondary | ICD-10-CM

## 2023-11-18 DIAGNOSIS — Q66222 Congenital metatarsus adductus, left foot: Secondary | ICD-10-CM

## 2023-11-18 NOTE — Progress Notes (Signed)
  Subjective:  Patient ID: [name] [name], female    DOB: 1971/12/16,  MRN: [id number]  Chief Complaint  Patient presents with   Routine Post Op     POV # 3 DOS 08/23/23 --- LEFT FOOT BUNION CORRECTION, MET ADDUCTUS CORRECTION WITH JOINT FUSIONS AND BONE CUTS BONE GRAFT FROM HEEL "It's doing good."    DOS: 08/23/2023 Procedure: Left foot multiple midfoot fusions with correction of bunionectomy and osteotomy of metatarsal  52 y.o. female returns for post-op check.  Doing well starting to feel much better she has been using a shoe some including the boot for longer distances  Review of Systems: Negative except as noted in the HPI. Denies N/V/F/Ch.   Objective:   There were no vitals filed for this visit.  There is no height or weight on file to calculate BMI. Constitutional Well developed. Well nourished.  Vascular Foot warm and well perfused. Capillary refill normal to all digits.  Calf is soft and supple, no posterior calf or knee pain, negative Homans' sign  Neurologic Normal speech. Oriented to person, place, and time. Epicritic sensation to light touch grossly present bilaterally.  Dermatologic Incisions are well-healed  Orthopedic: Mild edema.  Some limited MTP range of motion of the first toe   Multiple view plain film radiographs: Good correction is maintained she has near complete consolidation across all osteotomy and fusion sites Assessment:   1. Status post foot surgery   2. Hallux valgus of left foot   3. Congenital metatarsus adductus, left foot    Plan:  Patient was evaluated and treated and all questions answered.  S/p foot surgery left -Doing very well.  She does have some limited range of motion.  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  She would like to have improvement in the joint stiffness and I recommended physical therapy to work on this as well as gait training and strengthening of the  intrinsic musculatures with improvement in flexibility.  I will see her back in 3 months for new x-rays she may gradually increase her activity level and exercise at this point.  Return in about 3 months (around 02/18/2024) for surgery follow up (new xrays of left foot).

## 2023-12-03 NOTE — Therapy (Unsigned)
OUTPATIENT PHYSICAL THERAPY EVALUATION   Patient Name: [name] [name] MRN: [id number] DOB:1971-11-13, 52 y.o., female Today's Date: 12/03/2023  END OF SESSION:   Past Medical History:  Diagnosis Date   GERD (gastroesophageal reflux disease)    History of 2019 novel coronavirus disease (COVID-19) 04/10/2020   Motion sickness    roller coasters   Wears contact lenses    Past Surgical History:  Procedure Laterality Date   DILATION AND CURETTAGE OF UTERUS     ECTOPIC PREGNANCY SURGERY     ESOPHAGOGASTRODUODENOSCOPY (EGD) WITH PROPOFOL N/A 02/21/2018   Procedure: ESOPHAGOGASTRODUODENOSCOPY (EGD) WITH PROPOFOL;  Surgeon: Midge Minium, MD;  Location: [facility];  Service: Endoscopy;  Laterality: N/A;   POLYPECTOMY  02/21/2018   Procedure: POLYPECTOMY;  Surgeon: Midge Minium, MD;  Location: [facility];  Service: Endoscopy;;   Patient Active Problem List   Diagnosis Date Noted   Heartburn    Gastric polyp    Gastroesophageal reflux disease 09/27/2016   Hiatal hernia 09/27/2016   History of hay fever 10/19/2015   Foot pain 10/19/2015   Breast lump 10/19/2015   H/O gastrointestinal disease 10/19/2015   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  Rationale for Evaluation and Treatment: Rehabilitation  ONSET DATE: 08/23/2023 surgical date  SUBJECTIVE:   SUBJECTIVE STATEMENT: ***  Date of surgery 08/23/23 --- LEFT FOOT BUNION CORRECTION, MET ADDUCTUS CORRECTION WITH JOINT FUSIONS AND BONE CUTS BONE GRAFT FROM HEEL   Procedure: Left foot multiple midfoot fusions with correction of bunionectomy and osteotomy of metatarsal   From last podiatrist note on 11/18/2023: "She may transition back to her regular work duties without restrictions as well as  regular shoe gear. She would like to have improvement in the joint stiffness and I recommended physical therapy to work on this as well as gait training and strengthening of the intrinsic musculatures with improvement in flexibility. I will see her back in 3 months for new x-rays she may gradually increase her activity level and exercise at this point."  PERTINENT HISTORY: Patient is a 52 y.o. female who presents to outpatient physical therapy with a referral for medical diagnosis hallux valgus of left foot, congenital metatarsus adductus, left foot. This patient's chief complaints consist of ***, leading to the following functional deficits: ***. Relevant past medical history and comorbidities include ***.  Patient denies hx of {redflags:27294}   PAIN: Are you having pain? Yes NPRS: Current: ***/10,  Best: ***/10, Worst: ***/10. Pain location: *** Pain description: *** Aggravating factors: *** Relieving factors: ***   FUNCTIONAL LIMITATIONS: ***  LEISURE: ***  PRECAUTIONS: {Therapy precautions:24002} She may transition back to her regular work duties without restrictions as well as regular shoe gear.  WEIGHT BEARING RESTRICTIONS: {Yes ***/No:24003}  FALLS:  Has patient fallen in last 6 months? {fallsyesno:27318}  LIVING ENVIRONMENT: Lives with: {[facility] lives with:25569::"lives with their family"} Lives in: {Lives in:25570} Stairs: {opstairs:27293} Has following equipment at home: {Assistive devices:23999}  OCCUPATION: ***  PLOF: {PLOF:24004}  PATIENT GOALS: ***  NEXT MD VISIT: ***  OBJECTIVE  SELF- REPORTED FUNCTION FOTO score: ***/100 (foot questionnaire)  OBSERVATION/INSPECTION Posture Posture (seated): forward head, rounded shoulders, slumped in sitting.  Posture (standing): *** Posture correction: *** Anthropometrics Tremor: none Body composition: *** Muscle bulk: *** Skin: The incision sites appear to  be healing well with no excessive redness, warmth, drainage  or signs of infection present.  *** Edema: *** Functional Mobility Bed mobility: *** Transfers: *** Gait: grossly WFL for household and short community ambulation. More detailed gait analysis deferred to later date as needed. *** Stairs: ***  SPINE MOTION  LUMBAR SPINE AROM *Indicates pain Flexion: *** Extension: *** Side Flexion:   R ***  L *** Rotation:  R *** L *** Side glide:  R *** L ***    NEUROLOGICAL  Upper Motor Neuron Screen Babinski, Hoffman's and Clonus (ankle) negative bilaterally.  Dermatomes C2-T1 appears equal and intact to light touch except the following: *** L2-S2 appears equal and intact to light touch except the following: *** Deep Tendon Reflexes R/L  ***+/***+ Biceps brachii reflex (C5, C6) ***+/***+ Brachioradialis reflex (C6) ***+/***+ Triceps brachii reflex (C7) ***+/***+ Quadriceps reflex (L4) ***+/***+ Achilles reflex (S1)  SPINE MOTION  CERVICAL SPINE AROM *Indicates pain Flexion: *** Extension: *** Side Flexion:   R ***  L *** Rotation:  R *** L *** Protraction: *** Retrusion: ***   PERIPHERAL JOINT MOTION (in degrees)  ACTIVE RANGE OF MOTION (AROM) *Indicates pain Date Date Date  Joint/Motion R/L R/L R/L  Shoulder     Flexion / / /  Extension / / /  Abduction  / / /  External rotation / / /  Internal rotation / / /  Elbow     Flexion  / / /  Extension  / / /  Wrist     Flexion / / /  Extension  / / /  Radial deviation / / /  Ulnar deviation / / /  Pronation / / /  Supination / / /  Hip     Flexion / / /  Extension  / / /  Abduction / / /  Adduction / / /  External rotation / / /  Internal rotation  / / /  Knee     Extension / / /  Flexoin / / /  Ankle/Foot     Dorsiflexion (knee ext) / / /  Dorsiflexion (knee flex) / / /  Plantarflexion / / /  Everison / / /  Inversion / / /  Great toe extension / / /  Great toe flexion / / /  Comments:   PASSIVE RANGE OF MOTION (PROM) *Indicates pain  Date Date Date  Joint/Motion R/L R/L R/L  Shoulder     Flexion / / /  Extension / / /  Abduction  / / /  External rotation / / /  Internal rotation / / /  Elbow     Flexion  / / /  Extension  / / /  Wrist     Flexion / / /  Extension  / / /  Radial deviation / / /  Ulnar deviation / / /  Pronation / / /  Supination / / /  Hip     Flexion  / / /  Extension  / / /  Abduction / / /  Adduction / / /  External rotation / / /  Internal rotation  / / /  Knee     Extension / / /  Flexion / / /  Ankle/Foot     Dorsiflexion (knee ext) / / /  Dorsiflexion (knee flex) / / /  Plantarflexion / / /  Everison / / /  Inversion / / /  Great toe extension / / /  Great toe flexion / / /  Comments:   MUSCLE PERFORMANCE (MMT):  *Indicates pain Date Date Date  Joint/Motion R/L R/L R/L  Shoulder     Flexion / / /  Abduction (C5) / / /  External rotation / / /  Internal rotation / / /  Extension / / /  Elbow     Flexion (C6) / / /  Extension (C7) / / /  Wrist     Flexion (C7) / / /  Extension (C6) / / /  Radial deviation / / /  Ulnar deviation (C8) / / /  Pronation / / /  Supination / / /  Hand     Thumb extension (C8) / / /  Finger abduction (T1) / / /  Grip (C8) / / /  Hip     Flexion (L1, L2) / / /  Extension (knee ext) / / /  Extension (knee flex) / / /  Abduction / / /  Adduction / / /  External rotation / / /  Internal rotation  / / /  Knee     Extension (L3) / / /  Flexion (S2) / / /  Ankle/Foot     Dorsiflexion (L4) / / /  Great toe extension (L5) / / /  Eversion (S1) / / /  Plantarflexion (S1) / / /  Inversion / / /  Pronation / / /  Great toe flexion / / /  Comments:   SPECIAL TESTS:  .Neurodynamictests .NeurodynamicUE .NeurodynamicLE .CspineInstability .CSPINESPECIALTESTS .SHOULDERSPECIALTESTCLUSTERS .HIPSPECIALTESTS .SIJSPECIALTESTS   SHOULDER SPECIAL TESTS RTC, Impingement, Anterior Instability (macrotrauma), Labral Tear: Painful arc  test: R = ***, L = ***. Drop arm test: R = ***, L = ***. Hawkins-Kennedy test: R = ***, L = ***. Infraspinatus test: R = ***, L = ***. Apprehension test: R = ***, L = ***. Relocation test: R = ***, L = ***. Active compression test: R = ***, L = ***.  ACCESSORY MOTION: ***  PALPATION: ***  SUSTAINED POSITIONS TESTING:  ***  REPEATED MOTIONS TESTING: ***  FUNCTIONAL/BALANCE TESTS: Five Time Sit to Stand (5TSTS): *** seconds Functional Gait Assessment (FGA): ***/30 (see details above) Ten meter walking trial ( ): *** m/s Six Minute Walk Test ( ): *** feet Timed Up and Go (TUG): *** seconds   Dynamic Gait Index: ***/24 BERG Balance Scale: ***/56 Tinetti/POMA: ***/28 Timed Up and GO: *** seconds (average of 3 trials) Trial 1: *** Trial 2: *** Trial 3: *** Romberg test: -Narrow stance, eyes open: *** seconds -Narrow stance, eyes closed: *** seconds Sharpened Romberg test: -Tandem stance, eyes open: *** seconds -Tandem stance, eyes closed: *** seconds  Narrow stance, firm surface, eyes open: *** seconds Narrow stance, firm surface, eyes closed: *** seconds Narrow stance, compliant surface, eyes open: *** seconds Narrow stance, compliant surface, eyes closed: *** seconds Single leg stance, firm surface, eyes open: R= *** seconds, L= *** seconds Single leg stance, compliant surface, eyes open: R= *** seconds, L= *** seconds Gait speed: *** m/s Functional reach test: *** inches   TODAY'S TREATMENT:     PATIENT EDUCATION:  Education details: *** Person educated: {Person educated:25204} Education method: {Education Method:25205} Education comprehension: {Education Comprehension:25206}  HOME EXERCISE PROGRAM: ***  ASSESSMENT:  CLINICAL IMPRESSION: Patient is a 52 y.o. female referred to outpatient physical therapy with a medical diagnosis of hallux valgus of left foot, congenital metatarsus adductus, left foot who presents with signs and symptoms  consistent with ***. Patient presents  with significant *** impairments that are limiting ability to complete *** without difficulty. Patient will benefit from skilled physical therapy intervention to address current body structure impairments and activity limitations to improve function and work towards goals set in current POC in order to return to prior level of function or maximal functional improvement.   OBJECTIVE IMPAIRMENTS: {opptimpairments:25111}.   ACTIVITY LIMITATIONS: {activitylimitations:27494}  PARTICIPATION LIMITATIONS: {participationrestrictions:25113}  PERSONAL FACTORS: {Personal factors:25162} are also affecting patient's functional outcome.   REHAB POTENTIAL: {rehabpotential:25112}  CLINICAL DECISION MAKING: {clinical decision making:25114}  EVALUATION COMPLEXITY: {Evaluation complexity:25115}   GOALS: Goals reviewed with patient? {yes/no:20286}  SHORT TERM GOALS: Target date: 12/17/2023  Patient will be independent with initial home exercise program for self-management of symptoms. Baseline: {HEPbaseline4:27310} (12/03/23); Goal status: INITIAL   LONG TERM GOALS: Target date: 02/25/2024  Patient will be independent with a long-term home exercise program for self-management of symptoms.  Baseline: {HEPbaseline4:27310} (12/03/23); Goal status: INITIAL  2.  Patient will demonstrate improved FOTO to equal or greater than *** by visit #*** to demonstrate improvement in overall condition and self-reported functional ability.  Baseline: *** (12/03/23); Goal status: INITIAL  3.  *** Baseline: *** (12/03/23); Goal status: INITIAL  4.  *** Baseline: *** (12/03/23); Goal status: INITIAL  5.  Patient will demonstrate improvement in Patient Specific Functional Scale (PSFS) of equal or greater than 3 points to reflect clinically significant improvement in patient's most valued functional activities. Baseline: *** (12/03/23); Goal status: INITIAL  6.  Patient will  complete community, work and/or recreational activities without limitation due to current condition.  Baseline: *** (12/03/23); Goal status: INITIAL    PLAN:  PT FREQUENCY: {rehab frequency:25116}  PT DURATION: {rehab duration:25117}  PLANNED INTERVENTIONS: {rehab planned interventions:25118::"97110-Therapeutic exercises","97530- Therapeutic [phone]- Neuromuscular re-education","97535- Self MWUX","32440- Manual therapy"}  PLAN FOR NEXT SESSION: ***   Elajah Kunsman R. Ilsa Iha, PT, DPT 12/03/23, 1:57 PM  [facility] [address] [place], [place] [address] P: [phone] I F: [phone]

## 2023-12-05 ENCOUNTER — Ambulatory Visit: Payer: BC Managed Care – PPO | Attending: Podiatry | Admitting: Physical Therapy

## 2023-12-05 ENCOUNTER — Encounter: Payer: Self-pay | Admitting: Physical Therapy

## 2023-12-05 DIAGNOSIS — M2012 Hallux valgus (acquired), left foot: Secondary | ICD-10-CM | POA: Diagnosis not present

## 2023-12-05 DIAGNOSIS — R262 Difficulty in walking, not elsewhere classified: Secondary | ICD-10-CM | POA: Diagnosis present

## 2023-12-05 DIAGNOSIS — M6281 Muscle weakness (generalized): Secondary | ICD-10-CM | POA: Insufficient documentation

## 2023-12-05 DIAGNOSIS — M25672 Stiffness of left ankle, not elsewhere classified: Secondary | ICD-10-CM | POA: Diagnosis present

## 2023-12-05 DIAGNOSIS — M25675 Stiffness of left foot, not elsewhere classified: Secondary | ICD-10-CM | POA: Diagnosis present

## 2023-12-05 DIAGNOSIS — Q66222 Congenital metatarsus adductus, left foot: Secondary | ICD-10-CM | POA: Insufficient documentation

## 2023-12-05 NOTE — Therapy (Addendum)
OUTPATIENT PHYSICAL THERAPY LOWER EXTREMITY EVALUATION   Patient Name: [name] [name] MRN: [id number] DOB:04-Oct-1971, 52 y.o., female Today's Date: 12/05/2023  END OF SESSION:  PT End of Session - 12/05/23 2039     Visit Number 1    Number of Visits 17    Date for PT Re-Evaluation 02/27/24    Authorization Type BLUE CROSS BLUE SHIELD - Reporting period from 12/05/2023    Authorization Time Period 30 per cal yr/30 remain    Authorization - Visit Number 1    Authorization - Number of Visits 30    Progress Note Due on Visit 10    PT Start Time 1435    PT Stop Time 1527    PT Time Calculation (min) 52 min    Activity Tolerance Patient tolerated treatment well;No increased pain    Behavior During Therapy [facility] for tasks assessed/performed             Past Medical History:  Diagnosis Date   GERD (gastroesophageal reflux disease)    History of 2019 novel coronavirus disease (COVID-19) 04/10/2020   Motion sickness    roller coasters   Wears contact lenses    Past Surgical History:  Procedure Laterality Date   DILATION AND CURETTAGE OF UTERUS     ECTOPIC PREGNANCY SURGERY     ESOPHAGOGASTRODUODENOSCOPY (EGD) WITH PROPOFOL N/A 02/21/2018   Procedure: ESOPHAGOGASTRODUODENOSCOPY (EGD) WITH PROPOFOL;  Surgeon: Midge Minium, MD;  Location: [facility];  Service: Endoscopy;  Laterality: N/A;   POLYPECTOMY  02/21/2018   Procedure: POLYPECTOMY;  Surgeon: Midge Minium, MD;  Location: [facility];  Service: Endoscopy;;   Patient Active Problem List   Diagnosis Date Noted   Heartburn    Gastric polyp    Gastroesophageal reflux disease 09/27/2016   Hiatal hernia 09/27/2016   History of hay fever 10/19/2015   Foot pain 10/19/2015   Breast lump 10/19/2015   H/O gastrointestinal disease 10/19/2015   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 female who presents to outpatient physical therapy with a referral for medical diagnoses of Hallux valgus of left foot and congenital metatarsus adductus, left foot.  Pt reports foot seems to be feeling better every day.  She was NWB for awhile and now is trying to walk normally, but still finds herself limping.  She is scared to stand on her tip toes  DOS 08/23/23 --- LEFT FOOT BUNION CORRECTION, MET ADDUCTUS CORRECTION WITH JOINT FUSIONS AND BONE CUTS BONE GRAFT FROM HEEL   Procedure: Left foot multiple midfoot fusions with correction of bunionectomy and osteotomy of metatarsal  From last podiatrist note on 11/18/2023: "She may transition back to her regular work duties without restrictions as well as regular shoe gear. She would like to have improvement in the joint stiffness and I recommended physical therapy to work on this as well as gait training and strengthening of the intrinsic musculatures with improvement in flexibility. I will see her back in 3 months for new x-rays she may gradually  increase her activity level and exercise at this point."  PERTINENT HISTORY: Patient is a 52 y.o. female who presents to outpatient physical therapy with a referral for medical diagnosis of Hallux valgus of left foot and congenital metatarsus adductus, left foot. This patient's chief complaints consist of Stiffness across left foot.  Relevant past medical history and comorbidities include has History of hay fever; Foot pain; Breast lump; H/O gastrointestinal disease; Contact dermatitis due to Genus  Toxicodendron; Gastroesophageal reflux disease; Hiatal hernia; Heartburn; and Gastric polyp on their problem list.  has a past medical history of GERD (gastroesophageal reflux disease), History of 2019 novel coronavirus disease (COVID-19) (04/10/2020), Motion sickness, and Wears contact lenses.  has a past surgical history that includes Dilation and curettage of uterus; Ectopic pregnancy surgery; Esophagogastroduodenoscopy (egd) with propofol (N/A, 02/21/2018); and polypectomy (02/21/2018).    PAIN: Are you having pain? No (just stiffness). It gets tight and swollen at the end of the day, but no pain.    24 HOUR BEHAVIOR Stiffest first thing in the morning and late at night.  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  She spends a lot of time in the car and out and about removing things like signs from poles.  She occasionally has to lift some light objects.  LEISURE She is outdoorsey. She likes fishing and hunting, etc  FUNCTIONAL LIMITATIONS: Standing on tip toes, "walking normally"  PRECAUTIONS: No running or high impact activities  RED FLAGS: Patient denies hx of cancer, stroke, seizures, lung problems, heart problems, diabetes, unexplained weight loss, unexplained changes in bowel or bladder problems, unexplained stumbling or dropping things, osteoporosis, and spinal surgery  WEIGHT BEARING RESTRICTIONS: No  FALLS:  Has patient fallen in last 6 months? No   PLOF: Independent  PATIENT GOALS: walking normally, feel less stiff, walking on tiptoes  OBJECTIVE:  Note: Objective measures were completed at Evaluation unless otherwise noted.  COGNITION: Overall cognitive status: Within functional limits for tasks assessed     SENSATION: Not tested  EDEMA:  Edema throughout left foot and ankle upon inspection  OBSERVATION  Gait: slight antalgic gait  Squat: Did 5 squats in her normal shoes with proper form and no gross abnormalities  SLS: Could balance more than 30  seconds on both sides  LOWER EXTREMITY ROM (degrees):  Active ROM Right eval Left eval  Ankle dorsiflexion (closed chain) 22 15  Ankle plantarflexion 44 37  Ankle inversion 22 22  Ankle eversion 30 20  Great toe extension (PROM) 85 37  Great toe flexion (PROM) 45 25   (Blank rows = not tested)  LOWER EXTREMITY MMT:  MMT Right eval Left eval  Hip flexion    Hip extension    Hip abduction    Hip adduction    Hip internal rotation    Hip external rotation    Knee flexion    Knee extension    Ankle dorsiflexion 5/5 5/5  Ankle plantarflexion 10 heel raises double leg  Ankle inversion 5/5 5/5  Ankle eversion 5/5 5/5  Great toe extension 5/5 5/5   (Blank rows = not tested)  TODAY'S TREATMENT:  Therapeutic exercise: to centralize symptoms and improve ROM, strength, muscular endurance, and activity tolerance required for successful completion of functional activities.   Approximately 10 reps each per side of the following exercises:   - Foot doming   - Toe splays  Education on HEP including handout  PATIENT EDUCATION:  Education details: Education on diagnosis, prognosis, POC, anatomy and physiology of current condition.  Person educated: Patient Education method: Explanation Education comprehension: verbalized understanding  HOME EXERCISE PROGRAM: Access Code: [email or web address] URL: [email or web address] Date: 12/05/2023 Prepared by: Norton Blizzard  Exercises - Toe Yoga - Alternating Great Toe and Lesser Toe Extension  - 7 x weekly - 2 sets - 10 reps - Toe Spreading  - 1 x daily - 2 sets - 10 reps - 4 seconds hold - Seated Arch Lifts  - 7 x weekly - 2 sets - 10 reps - Towel Scrunches  - 1 x daily - 7 x weekly - 2 sets - 10 reps - Standing Heel Raise  - 7 x weekly - 2 sets - 10 reps  ASSESSMENT:  CLINICAL IMPRESSION: Patient is a 52  y.o. female referred to outpatient physical therapy with a medical diagnosis of Hallux valgus of left foot and congenital metatarsus adductus, left foot who presents with signs and symptoms consistent with decreased ROM across left foot and ankle, antalgic gait, and decreased motor control/coordination of left foot musculature.  Patient is s/p LEFT FOOT BUNION CORRECTION, MET ADDUCTUS CORRECTION WITH JOINT FUSIONS AND BONE CUTS BONE GRAFT FROM HEEL on 08/23/2023.  Patient will benefit from skilled physical therapy intervention to address current body structure impairments and activity limitations to improve function and work towards goals set in current POC in order to return to prior level of function or maximal functional improvement.    OBJECTIVE IMPAIRMENTS: Abnormal gait, decreased activity tolerance, decreased coordination, decreased endurance, decreased knowledge of condition, decreased mobility, difficulty walking, decreased ROM, hypomobility, increased edema, impaired perceived functional ability, impaired flexibility, and obesity.   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  has a past medical history of GERD (gastroesophageal reflux disease), History of 2019 novel coronavirus disease (COVID-19) (04/10/2020), Motion sickness, and Wears contact lenses.  has a past surgical history that includes Dilation and curettage of uterus; Ectopic pregnancy surgery; Esophagogastroduodenoscopy (egd) with propofol (N/A, 02/21/2018); and polypectomy (02/21/2018).   are also affecting patient's functional outcome.    REHAB POTENTIAL: Excellent  CLINICAL DECISION MAKING: Stable/uncomplicated  EVALUATION COMPLEXITY: Low   GOALS: Goals reviewed with patient? No  SHORT TERM GOALS: Target date: 12/19/2023  Patient will be independent with initial home exercise program for self-management of symptoms. Baseline: Initial HEP provided at IE (12/05/23); Goal status: INITIAL   LONG TERM GOALS: Target date: 02/27/2024  Patient will be independent with a long-term home exercise program for self-management of symptoms.  Baseline: Initial HEP provided at IE (12/05/23); Goal status: INITIAL  2.  Patient will demonstrate improved FOTO by equal or greater than 10 points by visit #10 to demonstrate improvement in overall condition and self-reported functional ability.  Baseline: To be assessed (12/05/23); Goal status: INITIAL  3.  Patient will complete 20 single leg heel raises on her left foot to allow her  to walk with proper gait mechanics and climb hills/embankments at work. Baseline: 10 double leg heel raises (12/05/23); Goal status: INITIAL  4.  Patient will demonstrate 65 degrees of MTP extension to allow patient to walk and climb stairs without risk of future injury. Baseline: 37 degrees (12/05/23); Goal status: INITIAL  5.  Patient will demonstrate improvement in Patient Specific Functional Scale (PSFS) of equal or greater than 3 points to reflect clinically significant improvement in patient's most valued functional activities. Baseline: To be assessed at visit 2 (12/05/23); Goal status: INITIAL   PLAN:  PT FREQUENCY: 1-2x/week  PT DURATION: 12 weeks  PLANNED INTERVENTIONS: 97110-Therapeutic exercises, 97530- Therapeutic activity, 97112- Neuromuscular re-education, 97535- Self Care, 19147- Manual therapy, [phone]- Gait training, Balance training, Stair training, Dry Needling, Joint mobilization, Joint manipulation, Scar mobilization, Compression bandaging, Cryotherapy, and Moist heat  PLAN  FOR NEXT SESSION: Take vitals, collect FOTO and PSFS baselines, begin strengthening exercises   Matthew Saras, Student-PT 12/05/2023, 8:50 PM   Student physical therapist under direct supervision of licensed physical therapists during the entirety of the session.   Luretha Murphy. Ilsa Iha, PT, DPT 12/05/23, 8:50 PM  [facility] Physical & [facility] Rehab [address] [place], [place] [address] P: [phone] I F: [phone]

## 2023-12-10 ENCOUNTER — Ambulatory Visit: Payer: BC Managed Care – PPO | Admitting: Physical Therapy

## 2023-12-12 ENCOUNTER — Encounter: Payer: BC Managed Care – PPO | Admitting: Physical Therapy

## 2023-12-23 ENCOUNTER — Encounter: Payer: BC Managed Care – PPO | Admitting: Physical Therapy

## 2023-12-26 ENCOUNTER — Ambulatory Visit: Payer: BC Managed Care – PPO | Attending: Podiatry | Admitting: Physical Therapy

## 2023-12-26 ENCOUNTER — Encounter: Payer: Self-pay | Admitting: Physical Therapy

## 2023-12-26 VITALS — BP 143/73 | HR 71

## 2023-12-26 DIAGNOSIS — M25672 Stiffness of left ankle, not elsewhere classified: Secondary | ICD-10-CM | POA: Diagnosis present

## 2023-12-26 DIAGNOSIS — M6281 Muscle weakness (generalized): Secondary | ICD-10-CM | POA: Diagnosis present

## 2023-12-26 DIAGNOSIS — M25675 Stiffness of left foot, not elsewhere classified: Secondary | ICD-10-CM | POA: Insufficient documentation

## 2023-12-26 DIAGNOSIS — R262 Difficulty in walking, not elsewhere classified: Secondary | ICD-10-CM | POA: Insufficient documentation

## 2023-12-26 NOTE — Therapy (Signed)
 OUTPATIENT PHYSICAL THERAPY TREATMENT   Patient Name: [name] [name] MRN: [id number] DOB:[birth year], 53 y.o., female Today's Date: 12/26/2023  END OF SESSION:  PT End of Session - 12/26/23 1634     Visit Number 2    Number of Visits 17    Date for PT Re-Evaluation 02/27/24    Authorization Type BLUE CROSS BLUE SHIELD - Reporting period from 12/05/2023    Authorization Time Period 30 per cal yr/30 remain    Authorization - Visit Number 1    Authorization - Number of Visits 30    Progress Note Due on Visit 10    PT Start Time 1433    PT Stop Time 1515    PT Time Calculation (min) 42 min    Activity Tolerance Patient tolerated treatment well;Patient limited by pain    Behavior During Therapy [facility] for tasks assessed/performed              Past Medical History:  Diagnosis Date   GERD (gastroesophageal reflux disease)    History of 2019 novel coronavirus disease (COVID-19) 04/10/2020   Motion sickness    roller coasters   Wears contact lenses    Past Surgical History:  Procedure Laterality Date   DILATION AND CURETTAGE OF UTERUS     ECTOPIC PREGNANCY SURGERY     ESOPHAGOGASTRODUODENOSCOPY (EGD) WITH PROPOFOL  N/A 02/21/2018   Procedure: ESOPHAGOGASTRODUODENOSCOPY (EGD) WITH PROPOFOL ;  Surgeon: Jinny Carmine, MD;  Location: [facility];  Service: Endoscopy;  Laterality: N/A;   POLYPECTOMY  02/21/2018   Procedure: POLYPECTOMY;  Surgeon: Jinny Carmine, MD;  Location: [facility];  Service: Endoscopy;;   Patient Active Problem List   Diagnosis Date Noted   Heartburn    Gastric polyp    Gastroesophageal reflux disease 09/27/2016   Hiatal hernia 09/27/2016   History of hay fever 10/19/2015   Foot pain 10/19/2015   Breast lump 10/19/2015   H/O gastrointestinal disease 10/19/2015   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 10/19/2015    PCP: Harvey Gaetana CROME, NP  REFERRING PROVIDER: Silva Juliene SAUNDERS, DPM  REFERRING DIAG: Hallux valgus of left foot;  Congenital metatarsus adductus, left foot   THERAPY DIAG:  Stiffness of left foot, not elsewhere classified  Stiffness of left ankle, not elsewhere classified  Difficulty in walking, not elsewhere classified  Muscle weakness (generalized)  Rationale for Evaluation and Treatment: Rehabilitation  ONSET DATE: Surgery on 08/23/2023 LEFT FOOT BUNION CORRECTION, MET ADDUCTUS CORRECTION WITH JOINT FUSIONS AND BONE CUTS BONE GRAFT FROM HEEL  SUBJECTIVE:    PERTINENT HISTORY: Patient is a 53 y.o. female who presents to outpatient physical therapy with a referral for medical diagnosis of Hallux valgus of left foot and congenital metatarsus adductus, left foot. This patient's chief complaints consist of Stiffness across left foot.  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  has a past medical history of GERD (gastroesophageal reflux disease), History of 2019 novel coronavirus disease (COVID-19) (04/10/2020), Motion sickness, and Wears contact lenses.  has a past surgical history that includes Dilation and curettage of uterus; Ectopic pregnancy surgery; Esophagogastroduodenoscopy (egd) with propofol  (N/A, 02/21/2018); and polypectomy (02/21/2018).   SUBJECTIVE STATEMENT: Patient states her left toes are getting better each day. She did her HEP about 30% of the time, but had trouble keeping up with it over the  holidays. She is back at work and this is going well and there is nothing she cannot do. She does have some difficulty being on her feet for a prolonged period and her foot gets swollen by the end of the day.   PAIN: Are you having pain?  NPRS: 0/10   PRECAUTIONS: No running or high impact activities  PATIENT GOALS: walking normally, feel less stiff, walking  on tiptoes  OBJECTIVE  Vitals:   12/26/23 1436  BP: (!) 143/73  Pulse: 71  SpO2: 98%    SELF-REPORTED FUNCTION FOTO score: 65/100 (foot questionnaire)  SELF-REPORTED FUNCTION Patient Specific Functional Scale (PSFS)  Stamina for standing: 6 Walking with no limp: 6 Average: 6  TODAY'S TREATMENT:       Therapeutic exercise: to centralize symptoms and improve ROM, strength, muscular endurance, and activity tolerance required for successful completion of functional activities.  Vitals check for systems review  Treadmill 2.5 mph at 0% grade with intermittent UE support. For improved lower extremity mobility, muscular endurance, and weightbearing activity tolerance; and to induce the analgesic effect of aerobic exercise, stimulate improved joint nutrition, and prepare body structures and systems for following interventions. x 7  minutes.   (Manual therapy - see below)  Seated L toe plantarflexion with foot under chair, focusing on great toe, 1x20 with 5 second holds  Seated L toe dorsiflexion with foot under chair focusing on great toe, 1x20 with 5 second hold  Seated L toe splay stretch with fingers between toes in figure 4 position, completing ankle circles, 1x10 each way.   Seated L great toe abduction with self PROM using hand, 1x5 with 5 second holds (prefers fingers in toes stretch).   Education on HEP including handout   Manual therapy: to reduce pain and tissue tension, improve range of motion, neuromodulation, in order to promote improved ability to complete functional activities. SEATED with back and legs supported:  L first MTP joint mobilization, grade III-IV 3x30 seconds PA glide 3x30 seconds AP glide L first MPT joint PROM 3x30 seconds DF 3x30 second PF Scar massage over left foot/toe incisions with education on how to perform at home.   Pt required multimodal cuing for proper technique and to facilitate improved neuromuscular control, strength, range of  motion, and functional ability resulting in improved performance and form.   PATIENT EDUCATION:  Education details: Exercise purpose/form. Self management techniques. Education on diagnosis, prognosis, POC, anatomy and physiology of current condition. Education on HEP including handout.  Person educated: Patient Education method: Explanation Education comprehension: verbalized understanding  HOME EXERCISE PROGRAM: Access Code: [email or web address] URL: [email or web address] Date: 12/26/2023 Prepared by: Camie Cleverly  Exercises - Standing Toe Dorsiflexion Stretch  - 1-2 x daily - 1 sets - 20 reps - 5 seconds hold - Seated Anterior Tibialis Stretch  - 1-2 x daily - 1 sets - 20 reps - 5 seconds hold - Toe Yoga - Alternating Great Toe and Lesser Toe Extension  - 7 x weekly - 2 sets - 10 reps - Toe Spreading  - 1 x daily - 2 sets - 10 reps - 4 seconds hold  HOME EXERCISE PROGRAM [[email or web address]] View at [email or web address] using code: [email or web address] Toe Mobility with Interlaced Fingers -  Complete 2 Sets, Perform 1 Times a Day  ASSESSMENT:  CLINICAL IMPRESSION: Patient arrives reporting 30% participation in HEP since initial eval 3 weeks ago. She continues to have significant stiffness in her left great toe that affects her gait pattern. Today's session focused on interventions to  improve joint range of motion including update of HEP to inclued more ROM/stretching. Patient had increased soreness by end of session but appeared to have good understanding of updates to HEP by end of session.Patient would benefit from continued management of limiting condition by skilled physical therapist to address remaining impairments and functional limitations to work towards stated goals and return to PLOF or maximal functional independence.   From initial PT evaluation on 12/05/2023:  Patient is a 53 y.o. female referred to outpatient physical therapy with a medical diagnosis of Hallux valgus of left foot and  congenital metatarsus adductus, left foot who presents with signs and symptoms consistent with decreased ROM across left foot and ankle, antalgic gait, and decreased motor control/coordination of left foot musculature.  Patient is s/p LEFT FOOT BUNION CORRECTION, MET ADDUCTUS CORRECTION WITH JOINT FUSIONS AND BONE CUTS BONE GRAFT FROM HEEL on 08/23/2023.  Patient will benefit from skilled physical therapy intervention to address current body structure impairments and activity limitations to improve function and work towards goals set in current POC in order to return to prior level of function or maximal functional improvement.    OBJECTIVE IMPAIRMENTS: Abnormal gait, decreased activity tolerance, decreased coordination, decreased endurance, decreased knowledge of condition, decreased mobility, difficulty walking, decreased ROM, hypomobility, increased edema, impaired perceived functional ability, impaired flexibility, and obesity.   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  has a past medical history of GERD (gastroesophageal reflux disease), History of 2019 novel coronavirus disease (COVID-19) (04/10/2020), Motion sickness, and Wears contact lenses.  has a past surgical history that includes Dilation and curettage of uterus; Ectopic pregnancy surgery; Esophagogastroduodenoscopy (egd) with propofol  (N/A, 02/21/2018); and polypectomy (02/21/2018).   are also affecting patient's functional outcome.   REHAB POTENTIAL: Excellent  CLINICAL DECISION MAKING: Stable/uncomplicated  EVALUATION COMPLEXITY:  Low   GOALS: Goals reviewed with patient? No  SHORT TERM GOALS: Target date: 12/19/2023  Patient will be independent with initial home exercise program for self-management of symptoms. Baseline: Initial HEP provided at IE (12/05/23); participating 30% of the time (12/26/23);  Goal status: In progress   LONG TERM GOALS: Target date: 02/27/2024  Patient will be independent with a long-term home exercise program for self-management of symptoms.  Baseline: Initial HEP provided at IE (12/05/23); participating 30% of the time (12/26/23);  Goal status: In progress  2.  Patient will demonstrate improved FOTO by equal or greater than 10 points by visit #10 to demonstrate improvement in overall condition and self-reported functional ability.  Baseline: To be assessed (12/05/23); 65 at visit #2 (12/26/23);  Goal status: In progress  3.  Patient will complete 20 single leg heel raises on her left foot to allow her to walk with proper gait mechanics and climb hills/embankments at work. Baseline: 10 double leg heel raises (12/05/23); Goal status: In progress  4.  Patient will demonstrate 65 degrees of MTP extension to allow patient to walk and climb stairs without risk of future injury. Baseline: 37 degrees (12/05/23); Goal status: In progress  5.  Patient will demonstrate improvement in Patient Specific Functional Scale (PSFS) of equal or greater than 3 points to reflect clinically significant improvement in patient's most valued functional activities. Baseline: To be  assessed at visit 2 (12/05/23); 6 at visit #2 (12/26/23);  Goal status: In progress   PLAN:  PT FREQUENCY: 1-2x/week  PT DURATION: 12 weeks  PLANNED INTERVENTIONS: 97110-Therapeutic exercises, 97530- Therapeutic activity, [id number]- Neuromuscular re-education, 97535- Self Care, [id number]- Manual therapy, [phone]- Gait training, Balance training, Stair training, Dry Needling, Joint mobilization, Joint manipulation, Scar mobilization,  Compression bandaging, Cryotherapy, and Moist heat  PLAN FOR NEXT SESSION: Update HEP as appropriate, progressive foot/ankle/toe ROM/strength/proprioception/functional exercises as tolerated. Manual therapy as needed. Education.   Camie SAUNDERS. Juli, PT, DPT 12/26/23, 4:40 PM  [facility] Re[facility] [address] [place], [place] [address] P: [phone] I F: [phone]

## 2023-12-31 ENCOUNTER — Ambulatory Visit: Payer: BC Managed Care – PPO | Admitting: Physical Therapy

## 2023-12-31 ENCOUNTER — Encounter: Payer: Self-pay | Admitting: Physical Therapy

## 2023-12-31 DIAGNOSIS — M25672 Stiffness of left ankle, not elsewhere classified: Secondary | ICD-10-CM

## 2023-12-31 DIAGNOSIS — M25675 Stiffness of left foot, not elsewhere classified: Secondary | ICD-10-CM | POA: Diagnosis not present

## 2023-12-31 DIAGNOSIS — M6281 Muscle weakness (generalized): Secondary | ICD-10-CM

## 2023-12-31 DIAGNOSIS — R262 Difficulty in walking, not elsewhere classified: Secondary | ICD-10-CM

## 2023-12-31 NOTE — Therapy (Signed)
 OUTPATIENT PHYSICAL THERAPY TREATMENT   Patient Name: [name] [name] MRN: [id number] DOB:Jul 07, 1971, 53 y.o., female Today's Date: 12/31/2023  END OF SESSION:  PT End of Session - 12/31/23 1436     Visit Number 3    Number of Visits 17    Date for PT Re-Evaluation 02/27/24    Authorization Type BLUE [employer] - Reporting period from 12/05/2023    Authorization Time Period 30 per cal yr/30 remain    Authorization - Visit Number 3    Authorization - Number of Visits 30    Progress Note Due on Visit 10    PT Start Time 1435    PT Stop Time 1514    PT Time Calculation (min) 39 min    Activity Tolerance Patient tolerated treatment well;Patient limited by pain    Behavior During Therapy [facility] for tasks assessed/performed               Past Medical History:  Diagnosis Date   GERD (gastroesophageal reflux disease)    History of 2019 novel coronavirus disease (COVID-19) 04/10/2020   Motion sickness    roller coasters   Wears contact lenses    Past Surgical History:  Procedure Laterality Date   DILATION AND CURETTAGE OF UTERUS     ECTOPIC PREGNANCY SURGERY     ESOPHAGOGASTRODUODENOSCOPY (EGD) WITH PROPOFOL  N/A 02/21/2018   Procedure: ESOPHAGOGASTRODUODENOSCOPY (EGD) WITH PROPOFOL ;  Surgeon: Jinny Carmine, MD;  Location: [facility];  Service: Endoscopy;  Laterality: N/A;   POLYPECTOMY  02/21/2018   Procedure: POLYPECTOMY;  Surgeon: Jinny Carmine, MD;  Location: [facility];  Service: Endoscopy;;   Patient Active Problem List   Diagnosis Date Noted   Heartburn    Gastric polyp    Gastroesophageal reflux disease 09/27/2016   Hiatal hernia 09/27/2016   History of hay fever 10/19/2015   Foot pain 10/19/2015   Breast lump 10/19/2015   H/O gastrointestinal disease 10/19/2015   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 10/19/2015    PCP: Harvey Gaetana CROME, NP  REFERRING PROVIDER: Silva Juliene SAUNDERS, DPM  REFERRING DIAG: Hallux valgus of left  foot; Congenital metatarsus adductus, left foot   THERAPY DIAG:  Stiffness of left foot, not elsewhere classified  Stiffness of left ankle, not elsewhere classified  Difficulty in walking, not elsewhere classified  Muscle weakness (generalized)  Rationale for Evaluation and Treatment: Rehabilitation  ONSET DATE: Surgery on 08/23/2023 LEFT FOOT BUNION CORRECTION, MET ADDUCTUS CORRECTION WITH JOINT FUSIONS AND BONE CUTS BONE GRAFT FROM HEEL  SUBJECTIVE:    PERTINENT HISTORY: Patient is a 53 y.o. female who presents to outpatient physical therapy with a referral for medical diagnosis of Hallux valgus of left foot and congenital metatarsus adductus, left foot. This patient's chief complaints consist of Stiffness across left foot.  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  has a past medical history of GERD (gastroesophageal reflux disease), History of 2019 novel coronavirus disease (COVID-19) (04/10/2020), Motion sickness, and Wears contact lenses.  has a past surgical history that includes Dilation and curettage of uterus; Ectopic pregnancy surgery; Esophagogastroduodenoscopy (egd) with propofol  (N/A, 02/21/2018); and polypectomy (02/21/2018).   SUBJECTIVE STATEMENT: Patient states her left toes are getting better each day. She did her HEP about 30% of the time, but had trouble keeping up with it over  the holidays. She is back at work and this is going well and there is nothing she cannot do. She does have some difficulty being on her feet for a prolonged period and her foot gets swollen by the end of the day.   PAIN: Are you having pain?  NPRS: 0/10   PRECAUTIONS: No running or high impact activities  PATIENT GOALS: walking normally, feel less stiff,  walking on tiptoes  OBJECTIVE   TODAY'S TREATMENT:       Therapeutic exercise: to centralize symptoms and improve ROM, strength, muscular endurance, and activity tolerance required for successful completion of functional activities.   Treadmill 2.7 mph at 0% grade with intermittent UE support. For improved lower extremity mobility, muscular endurance, and weightbearing activity tolerance; and to induce the analgesic effect of aerobic exercise, stimulate improved joint nutrition, and prepare body structures and systems for following interventions. x 5  minutes.   Seated L toe plantarflexion with foot under chair, focusing on great toe, 1x10 with 5 second holds  Seated L toe dorsiflexion with foot under chair focusing on great toe, 1x10 with 5 second hold  Seated L toe splays, repeated 3 sec holds. Ball of foot and heel maintains contact with floor. To improve intrinsic foot muscle activation and strength in order to better support arch and intrinsic foot structures. 1x7  Seated L Toe Yoga: great toe extension with small toes flexion pressure into floor, repeated 3 sec holds; small toes extension with great toe flexion pressure into floor, repeated 3 sec holds. Ball of foot and heel maintains contact with floor. To improve intrinsic foot muscle activation and strength in order to better support arch and intrinsic foot structures. 1x5-10 attempts each direction.   Seated L toe splay stretch with fingers between toes in figure 4 position, completing ankle circles, 1x20 each way.  Standing heel raises with double leg stance: 1x20  Education on HEP including handout    Neuromuscular Re-education: to improve, balance, postural strength, muscle activation patterns, and stabilization strength required for functional activities:  Single leg stance balance, 3x30 seconds R LE and 2x30 second L LE with balloon tapping after first set on left.  Therapeutic activities: dynamic activities for  functional strengthening and improved functional activity tolerance.  Reverse lunges with UE support as needed, 1x15 each side   Manual therapy: to reduce pain and tissue tension, improve range of motion, neuromodulation, in order to promote improved ability to complete functional activities. SEATED with back and legs supported:  L first MTP joint mobilization, grade III-IV 3x30 seconds PA glide 3x30 seconds AP glide   Pt required multimodal cuing for proper technique and to facilitate improved neuromuscular control, strength, range of motion, and functional ability resulting in improved performance and form.   PATIENT EDUCATION:  Education details: Exercise purpose/form. Self management techniques. Education on diagnosis, prognosis, POC, anatomy and physiology of current condition. Education on HEP including handout.  Person educated: Patient Education method: Explanation Education comprehension: verbalized understanding  HOME EXERCISE PROGRAM: Access Code: [email or web address] URL: [email or web address] Date: 12/31/2023 Prepared by: Camie Cleverly  Exercises - Standing Toe Dorsiflexion Stretch  - 1-2 x daily - 1 sets - 20 reps - 5 seconds hold - Seated Anterior Tibialis Stretch  - 1-2 x daily - 1 sets - 20 reps - 5 seconds hold - Toe Yoga - Alternating Great Toe and Lesser Toe Extension  - 7 x weekly - 2 sets - 10 reps - Toe Spreading  - 1 x daily - 2  sets - 10 reps - 4 seconds hold - Standing Heel Raises  - 3 x weekly - 2 sets - 20 reps - Reverse Lunge on Slider  - 3 x weekly - 3 sets - 10 reps  HOME EXERCISE PROGRAM [[email or web address]] View at [email or web address] using code: [email or web address] Toe Mobility with Interlaced Fingers -  Complete 2 Sets, Perform 1 Times a Day  ASSESSMENT:  CLINICAL IMPRESSION: Patient arrives with good tolerance to last PT session and reporting good participation in HEP. She demonstrated good form with HEP. Today's session continued focus on improving L great  toe ROM and progressions to more weight bearing positions for improved return to usual activities. Patient tolerated treatment well with mild increase in discomfort by end of session. Patient would benefit from continued management of limiting condition by skilled physical therapist to address remaining impairments and functional limitations to work towards stated goals and return to PLOF or maximal functional independence.   From initial PT evaluation on 12/05/2023:  Patient is a 53 y.o. female referred to outpatient physical therapy with a medical diagnosis of Hallux valgus of left foot and congenital metatarsus adductus, left foot who presents with signs and symptoms consistent with decreased ROM across left foot and ankle, antalgic gait, and decreased motor control/coordination of left foot musculature.  Patient is s/p LEFT FOOT BUNION CORRECTION, MET ADDUCTUS CORRECTION WITH JOINT FUSIONS AND BONE CUTS BONE GRAFT FROM HEEL on 08/23/2023.  Patient will benefit from skilled physical therapy intervention to address current body structure impairments and activity limitations to improve function and work towards goals set in current POC in order to return to prior level of function or maximal functional improvement.    OBJECTIVE IMPAIRMENTS: Abnormal gait, decreased activity tolerance, decreased coordination, decreased endurance, decreased knowledge of condition, decreased mobility, difficulty walking, decreased ROM, hypomobility, increased edema, impaired perceived functional ability, impaired flexibility, and obesity.   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  has a past medical history of GERD (gastroesophageal reflux disease), History of 2019 novel coronavirus disease (COVID-19) (04/10/2020), Motion sickness, and Wears contact lenses.  has a past surgical history that includes Dilation and curettage of uterus; Ectopic pregnancy surgery; Esophagogastroduodenoscopy (egd) with propofol  (N/A, 02/21/2018); and polypectomy (02/21/2018).   are also affecting patient's functional outcome.   REHAB POTENTIAL: Excellent  CLINICAL DECISION MAKING: Stable/uncomplicated  EVALUATION COMPLEXITY: Low   GOALS: Goals reviewed with patient? No  SHORT TERM GOALS: Target date: 12/19/2023  Patient will be independent with initial home exercise program for self-management of symptoms. Baseline: Initial HEP provided at IE (12/05/23); participating 30% of the time (12/26/23);  Goal status: In progress   LONG TERM GOALS: Target date: 02/27/2024  Patient will be independent with a long-term home exercise program for self-management of symptoms.  Baseline: Initial HEP provided at IE (12/05/23); participating 30% of the time (12/26/23);  Goal status: In progress  2.  Patient will demonstrate improved FOTO by equal or greater than 10 points by visit #10 to demonstrate improvement in overall condition and self-reported functional ability.  Baseline: To be assessed (12/05/23); 65 at visit #2 (12/26/23);  Goal status: In progress  3.  Patient will complete 20 single leg heel raises on her  left foot to allow her to walk with proper gait mechanics and climb hills/embankments at work. Baseline: 10 double leg heel raises (12/05/23); Goal status: In progress  4.  Patient will demonstrate 65 degrees of MTP extension to allow patient to walk and climb stairs without risk of future injury. Baseline: 37 degrees (12/05/23); Goal status: In progress  5.  Patient will demonstrate improvement in Patient Specific Functional  Scale (PSFS) of equal or greater than 3 points to reflect clinically significant improvement in patient's most valued functional activities. Baseline: To be assessed at visit 2 (12/05/23); 6 at visit #2 (12/26/23);  Goal status: In progress   PLAN:  PT FREQUENCY: 1-2x/week  PT DURATION: 12 weeks  PLANNED INTERVENTIONS: 97110-Therapeutic exercises, 97530- Therapeutic activity, [id number]- Neuromuscular re-education, 97535- Self Care, [id number]- Manual therapy, [phone]- Gait training, Balance training, Stair training, Dry Needling, Joint mobilization, Joint manipulation, Scar mobilization, Compression bandaging, Cryotherapy, and Moist heat  PLAN FOR NEXT SESSION: Update HEP as appropriate, progressive foot/ankle/toe ROM/strength/proprioception/functional exercises as tolerated. Manual therapy as needed. Education.   Camie SAUNDERS. Juli, PT, DPT 12/31/23, 3:30 PM  [facility] [address] [place], [place] [address] P: [phone] I F: [phone]

## 2024-01-02 ENCOUNTER — Ambulatory Visit: Payer: BC Managed Care – PPO | Admitting: Physical Therapy

## 2024-01-07 ENCOUNTER — Encounter: Payer: Self-pay | Admitting: Physical Therapy

## 2024-01-07 ENCOUNTER — Ambulatory Visit: Payer: BC Managed Care – PPO | Admitting: Physical Therapy

## 2024-01-07 DIAGNOSIS — M25672 Stiffness of left ankle, not elsewhere classified: Secondary | ICD-10-CM

## 2024-01-07 DIAGNOSIS — M6281 Muscle weakness (generalized): Secondary | ICD-10-CM

## 2024-01-07 DIAGNOSIS — R262 Difficulty in walking, not elsewhere classified: Secondary | ICD-10-CM

## 2024-01-07 DIAGNOSIS — M25675 Stiffness of left foot, not elsewhere classified: Secondary | ICD-10-CM

## 2024-01-07 NOTE — Therapy (Signed)
 OUTPATIENT PHYSICAL THERAPY TREATMENT   Patient Name: [name] [name] MRN: [id number] DOB:March 08, 1971, 53 y.o., female Today's Date: 01/07/2024  END OF SESSION:  PT End of Session - 01/07/24 1435     Visit Number 4    Number of Visits 17    Date for PT Re-Evaluation 02/27/24    Authorization Type BLUE [employer] - Reporting period from 12/05/2023    Authorization Time Period 30 per cal yr/30 remain    Authorization - Visit Number 4    Authorization - Number of Visits 30    Progress Note Due on Visit 10    PT Start Time 1435    PT Stop Time 1513    PT Time Calculation (min) 38 min    Activity Tolerance Patient tolerated treatment well;Patient limited by pain    Behavior During Therapy [facility] for tasks assessed/performed                Past Medical History:  Diagnosis Date   GERD (gastroesophageal reflux disease)    History of 2019 novel coronavirus disease (COVID-19) 04/10/2020   Motion sickness    roller coasters   Wears contact lenses    Past Surgical History:  Procedure Laterality Date   DILATION AND CURETTAGE OF UTERUS     ECTOPIC PREGNANCY SURGERY     ESOPHAGOGASTRODUODENOSCOPY (EGD) WITH PROPOFOL  N/A 02/21/2018   Procedure: ESOPHAGOGASTRODUODENOSCOPY (EGD) WITH PROPOFOL ;  Surgeon: Jinny Carmine, MD;  Location: [facility];  Service: Endoscopy;  Laterality: N/A;   POLYPECTOMY  02/21/2018   Procedure: POLYPECTOMY;  Surgeon: Jinny Carmine, MD;  Location: [facility];  Service: Endoscopy;;   Patient Active Problem List   Diagnosis Date Noted   Heartburn    Gastric polyp    Gastroesophageal reflux disease 09/27/2016   Hiatal hernia 09/27/2016   History of hay fever 10/19/2015   Foot pain 10/19/2015   Breast lump 10/19/2015   H/O gastrointestinal disease 10/19/2015   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 10/19/2015    PCP: Harvey Gaetana CROME, NP  REFERRING PROVIDER: Silva Juliene SAUNDERS, DPM  REFERRING DIAG: Hallux valgus of left  foot; Congenital metatarsus adductus, left foot   THERAPY DIAG:  Stiffness of left foot, not elsewhere classified  Stiffness of left ankle, not elsewhere classified  Difficulty in walking, not elsewhere classified  Muscle weakness (generalized)  Rationale for Evaluation and Treatment: Rehabilitation  ONSET DATE: Surgery on 08/23/2023 LEFT FOOT BUNION CORRECTION, MET ADDUCTUS CORRECTION WITH JOINT FUSIONS AND BONE CUTS BONE GRAFT FROM HEEL  SUBJECTIVE:    PERTINENT HISTORY: Patient is a 53 y.o. female who presents to outpatient physical therapy with a referral for medical diagnosis of Hallux valgus of left foot and congenital metatarsus adductus, left foot. This patient's chief complaints consist of Stiffness across left foot.  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  has a past medical history of GERD (gastroesophageal reflux disease), History of 2019 novel coronavirus disease (COVID-19) (04/10/2020), Motion sickness, and Wears contact lenses.  has a past surgical history that includes Dilation and curettage of uterus; Ectopic pregnancy surgery; Esophagogastroduodenoscopy (egd) with propofol  (N/A, 02/21/2018); and polypectomy (02/21/2018).   SUBJECTIVE STATEMENT: Patient states she is feeling okay today. She felt okay after last PT session except the lunge puts a lot of pressure on her great toe  joint. HEP is going okay except the lunge is pretty hard because of the great toe pain/stiffness. She states the ball of her foot is sore. She thinks she might overdo it on the weekend since she is not at work.   PAIN: Are you having pain?  NPRS: 0/10   PRECAUTIONS: No running or high impact activities  PATIENT GOALS: walking normally, feel less stiff,  walking on tiptoes  OBJECTIVE   TODAY'S TREATMENT:       Therapeutic exercise: to centralize symptoms and improve ROM, strength, muscular endurance, and activity tolerance required for successful completion of functional activities.   Treadmill 2.9 mph at 0% grade with intermittent UE support. For improved lower extremity mobility, muscular endurance, and weightbearing activity tolerance; and to induce the analgesic effect of aerobic exercise, stimulate improved joint nutrition, and prepare body structures and systems for following interventions. x 5 minutes.   Standing L toe dorsiflexion focusing on great toe, 1x20 with 5 second hold  Standing heel raises with double leg stance with forefoot on airex pad (UE support available as needed, but minimally used): 2x20  Neuromuscular Re-education: to improve, balance, postural strength, muscle activation patterns, and stabilization strength required for functional activities:  Single leg stance balance on airex pad, 3x30 L LE< 2x30 seconds on right.  Semi tandem walking across airex beam (5 feet),  1x10 forwards 1x10 backwards  Forefoot side stepping on airex beam (5 feet), 1x5 each direction  Therapeutic activities: dynamic activities for functional strengthening and improved functional activity tolerance.  Reverse lunges U UE support as needed, 1x15 L LE back  Pt required multimodal cuing for proper technique and to facilitate improved neuromuscular control, strength, range of motion, and functional ability resulting in improved performance and form.   PATIENT EDUCATION:  Education details: Exercise purpose/form. Self management techniques. Education on diagnosis, prognosis, POC, anatomy and physiology of current condition. Education on HEP including handout.  Person educated: Patient Education method: Explanation Education comprehension: verbalized understanding  HOME EXERCISE PROGRAM: Access Code: [email or web address] URL:  [email or web address] Date: 12/31/2023 Prepared by: Camie Cleverly  Exercises - Standing Toe Dorsiflexion Stretch  - 1-2 x daily - 1 sets - 20 reps - 5 seconds hold - Seated Anterior Tibialis Stretch  - 1-2 x daily - 1 sets - 20 reps - 5 seconds hold - Toe Yoga - Alternating Great Toe and Lesser Toe Extension  - 7 x weekly - 2 sets - 10 reps - Toe Spreading  - 1 x daily - 2 sets - 10 reps - 4 seconds hold - Standing Heel Raises  - 3 x weekly - 2 sets - 20 reps - Reverse Lunge on Slider  - 3 x weekly - 3 sets - 10 reps  HOME EXERCISE PROGRAM [[email or web address]] View at [email or web address] using code: [email or web address] Toe Mobility with Interlaced Fingers -  Complete 2 Sets, Perform 1 Times a Day  ASSESSMENT:  CLINICAL IMPRESSION: Patient arrives with good tolerance to last PT session and apparent continued good participation in HEP. Continued with exercises for strengthening, proprioception, and activity tolerance/endurance. Mixed exercises that stretch Great toe dorsiflexion in with more comfortable exercises to improve tolerance. Patient continued with good tolerance for increases in complexity and difficulty, with continued limitations due to her left toe stiffness and lack of strength/endurance. Patient would benefit from continued management of limiting condition by skilled physical therapist to address remaining impairments and functional limitations to work towards stated goals and return to PLOF or maximal functional independence.   From  initial PT evaluation on 12/05/2023:  Patient is a 53 y.o. female referred to outpatient physical therapy with a medical diagnosis of Hallux valgus of left foot and congenital metatarsus adductus, left foot who presents with signs and symptoms consistent with decreased ROM across left foot and ankle, antalgic gait, and decreased motor control/coordination of left foot musculature.  Patient is s/p LEFT FOOT BUNION CORRECTION, MET ADDUCTUS CORRECTION WITH JOINT  FUSIONS AND BONE CUTS BONE GRAFT FROM HEEL on 08/23/2023.  Patient will benefit from skilled physical therapy intervention to address current body structure impairments and activity limitations to improve function and work towards goals set in current POC in order to return to prior level of function or maximal functional improvement.    OBJECTIVE IMPAIRMENTS: Abnormal gait, decreased activity tolerance, decreased coordination, decreased endurance, decreased knowledge of condition, decreased mobility, difficulty walking, decreased ROM, hypomobility, increased edema, impaired perceived functional ability, impaired flexibility, and obesity.   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  has a past medical history of GERD (gastroesophageal reflux disease), History of 2019 novel coronavirus disease (COVID-19) (04/10/2020), Motion sickness, and Wears contact lenses.  has a past surgical history that includes Dilation and curettage of uterus; Ectopic pregnancy surgery; Esophagogastroduodenoscopy (egd) with propofol  (N/A, 02/21/2018); and polypectomy (02/21/2018).   are also affecting patient's functional outcome.   REHAB POTENTIAL: Excellent  CLINICAL DECISION MAKING: Stable/uncomplicated  EVALUATION COMPLEXITY: Low   GOALS: Goals reviewed with patient? No  SHORT TERM GOALS: Target date: 12/19/2023  Patient will be independent with initial home exercise program for self-management of symptoms. Baseline: Initial HEP provided at IE (12/05/23); participating 30% of the time (12/26/23);  Goal status: In  progress   LONG TERM GOALS: Target date: 02/27/2024  Patient will be independent with a long-term home exercise program for self-management of symptoms.  Baseline: Initial HEP provided at IE (12/05/23); participating 30% of the time (12/26/23);  Goal status: In progress  2.  Patient will demonstrate improved FOTO by equal or greater than 10 points by visit #10 to demonstrate improvement in overall condition and self-reported functional ability.  Baseline: To be assessed (12/05/23); 65 at visit #2 (12/26/23);  Goal status: In progress  3.  Patient will complete 20 single leg heel raises on her left foot to allow her to walk with proper gait mechanics and climb hills/embankments at work. Baseline: 10 double leg heel raises (12/05/23); Goal status: In progress  4.  Patient will demonstrate 65 degrees of MTP extension to allow patient to walk and climb stairs without risk of future injury. Baseline: 37 degrees (12/05/23); Goal status: In progress  5.  Patient will demonstrate improvement in Patient Specific Functional Scale (PSFS) of equal or greater than 3 points to reflect clinically significant improvement in patient's most valued functional activities. Baseline: To be assessed at visit 2 (12/05/23); 6 at visit #2 (12/26/23);  Goal status: In progress   PLAN:  PT FREQUENCY: 1-2x/week  PT DURATION: 12 weeks  PLANNED INTERVENTIONS: 97110-Therapeutic exercises, 97530- Therapeutic activity, [id number]- Neuromuscular re-education, 97535- Self Care, [id number]- Manual therapy, [phone]- Gait training, Balance training, Stair training, Dry Needling, Joint mobilization, Joint manipulation, Scar mobilization, Compression bandaging, Cryotherapy, and Moist heat  PLAN FOR NEXT SESSION:  Update HEP as appropriate, progressive foot/ankle/toe ROM/strength/proprioception/functional exercises as tolerated. Manual therapy as needed. Education.   Camie SAUNDERS. Juli, PT, DPT 01/07/24, 3:15 PM  [facility] [address] [place], [place] [address] P: [phone] I F: [phone]

## 2024-01-09 ENCOUNTER — Encounter: Payer: Self-pay | Admitting: Physical Therapy

## 2024-01-09 ENCOUNTER — Ambulatory Visit: Payer: BC Managed Care – PPO | Admitting: Physical Therapy

## 2024-01-09 DIAGNOSIS — M6281 Muscle weakness (generalized): Secondary | ICD-10-CM

## 2024-01-09 DIAGNOSIS — M25675 Stiffness of left foot, not elsewhere classified: Secondary | ICD-10-CM

## 2024-01-09 DIAGNOSIS — R262 Difficulty in walking, not elsewhere classified: Secondary | ICD-10-CM

## 2024-01-09 DIAGNOSIS — M25672 Stiffness of left ankle, not elsewhere classified: Secondary | ICD-10-CM

## 2024-01-09 NOTE — Therapy (Addendum)
OUTPATIENT PHYSICAL THERAPY TREATMENT   Patient Name: [name] [name] MRN: [id number] DOB:1971-09-21, 53 y.o., female Today's Date: 01/09/2024  END OF SESSION:  PT End of Session - 01/09/24 1521     Visit Number 5    Number of Visits 17    Date for PT Re-Evaluation 02/27/24    Authorization Type BLUE [employer] - Reporting period from 12/05/2023    Authorization Time Period 30 per cal yr/30 remain    Authorization - Visit Number 5    Authorization - Number of Visits 30    Progress Note Due on Visit 10    PT Start Time 1432    PT Stop Time 1517    PT Time Calculation (min) 45 min    Activity Tolerance Patient tolerated treatment well;Patient limited by pain    Behavior During Therapy [facility] for tasks assessed/performed                 Past Medical History:  Diagnosis Date   GERD (gastroesophageal reflux disease)    History of 2019 novel coronavirus disease (COVID-19) 04/10/2020   Motion sickness    roller coasters   Wears contact lenses    Past Surgical History:  Procedure Laterality Date   DILATION AND CURETTAGE OF UTERUS     ECTOPIC PREGNANCY SURGERY     ESOPHAGOGASTRODUODENOSCOPY (EGD) WITH PROPOFOL N/A 02/21/2018   Procedure: ESOPHAGOGASTRODUODENOSCOPY (EGD) WITH PROPOFOL;  Surgeon: Midge Minium, MD;  Location: [facility];  Service: Endoscopy;  Laterality: N/A;   POLYPECTOMY  02/21/2018   Procedure: POLYPECTOMY;  Surgeon: Midge Minium, MD;  Location: [facility];  Service: Endoscopy;;   Patient Active Problem List   Diagnosis Date Noted   Heartburn    Gastric polyp    Gastroesophageal reflux disease 09/27/2016   Hiatal hernia 09/27/2016   History of hay fever 10/19/2015   Foot pain 10/19/2015   Breast lump 10/19/2015   H/O gastrointestinal disease 10/19/2015   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 10/19/2015    PCP: Alm Bustard, NP  REFERRING PROVIDER: Edwin Cap, DPM  REFERRING DIAG: Hallux valgus of left  foot; Congenital metatarsus adductus, left foot   THERAPY DIAG:  Stiffness of left foot, not elsewhere classified  Stiffness of left ankle, not elsewhere classified  Difficulty in walking, not elsewhere classified  Muscle weakness (generalized)  Rationale for Evaluation and Treatment: Rehabilitation  ONSET DATE: Surgery on 08/23/2023 LEFT FOOT BUNION CORRECTION, MET ADDUCTUS CORRECTION WITH JOINT FUSIONS AND BONE CUTS BONE GRAFT FROM HEEL  SUBJECTIVE:    PERTINENT HISTORY: Patient is a 53 y.o. female who presents to outpatient physical therapy with a referral for medical diagnosis of Hallux valgus of left foot and congenital metatarsus adductus, left foot. This patient's chief complaints consist of Stiffness across left foot.  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  has a past medical history of GERD (gastroesophageal reflux disease), History of 2019 novel coronavirus disease (COVID-19) (04/10/2020), Motion sickness, and Wears contact lenses.  has a past surgical history that includes Dilation and curettage of uterus; Ectopic pregnancy surgery; Esophagogastroduodenoscopy (egd) with propofol (N/A, 02/21/2018); and polypectomy (02/21/2018).   SUBJECTIVE STATEMENT: Patient states she is feeling soreness today on the top and side of her left big toe joint, but no pain. She reports her HEP  has been going well, she did all of her exercises this morning, and she thinks the lunges are getting easier. She states after last session, she felt like her foot was worked out and stretched out resulting in soreness afterwards but no pain. Patient reports her swelling is getting better, but her big toe joint still gets swollen/stiff in the afternoons and finds it easier to  perform her exercises in the morning when it feels less stiff.    PAIN: Are you having pain?  NPRS: 0/10   PRECAUTIONS: No running or high impact activities  PATIENT GOALS: walking normally, feel less stiff, walking on tiptoes   OBJECTIVE  TODAY'S TREATMENT:       Therapeutic exercise: to centralize symptoms and improve ROM, strength, muscular endurance, and activity tolerance required for successful completion of functional activities.   Treadmill 3.1 mph at 0% grade with intermittent UE support. For improved lower extremity mobility, muscular endurance, and weightbearing activity tolerance; and to induce the analgesic effect of aerobic exercise, stimulate improved joint nutrition, and prepare body structures and systems for following interventions. x 6 minutes.   Standing heel raises with double leg stance with forefoot on airex pad (UE support available as needed, but minimally used): 2x20  Neuromuscular Re-education: to improve, balance, postural strength, muscle activation patterns, and stabilization strength required for functional activities:  Ball toss with single leg stance balance on firm surface at [employer] with pink ball, 2x20 each leg  Semi tandem walking across airex beam (5 feet),  1x10 forwards 1x10 backwards  Forefoot side stepping on airex beam (5 feet), 1x5 each direction   Therapeutic activities: dynamic activities for functional strengthening and improved functional activity tolerance.  Reverse lunges U UE support as needed, 1x15 L LE back   Runners step ups on 12 inch step (UE support available if needed, minimal use), 2x20 each leg  Pt required multimodal cuing for proper technique and to facilitate improved neuromuscular control, strength, range of motion, and functional ability resulting in improved performance and form.    PATIENT EDUCATION:  Education details: Exercise purpose/form. Self management techniques. Education on diagnosis, prognosis,  POC, anatomy and physiology of current condition. Education on HEP including handout.  Person educated: Patient Education method: Explanation Education comprehension: verbalized understanding  HOME EXERCISE PROGRAM: Access Code: [email or web address] URL: [email or web address] Date: 01/09/2024 Prepared by: Darthy Manganelli [place]  Exercises - Standing Toe Dorsiflexion Stretch  - 1-2 x daily - 1 sets - 20 reps - 5 seconds hold - Seated Anterior Tibialis Stretch  - 1-2 x daily - 1 sets - 20 reps - 5 seconds hold - Toe Yoga - Alternating Great Toe and Lesser Toe Extension  - 7 x weekly - 2 sets - 10 reps - Toe Spreading  - 1 x daily - 2 sets - 10 reps - 4 seconds hold - Standing Heel Raises  - 3 x weekly - 2 sets - 20 reps - Reverse Lunge on Slider  - 3 x weekly - 3 sets - 10 reps - Reverse Lunge  - 3 x weekly - 2 sets - 15 reps - Single Leg Stance  - 3 x weekly - 2 sets - 30 hold - Heel Raise on Step  - 3 x weekly - 2 sets - 20 reps  ASSESSMENT:  CLINICAL IMPRESSION: Patient arrives with good tolerance to last PT session and apparent continued good participation in HEP. Today's session focused on continuing with exercises for strengthening, proprioception, and activity tolerance/endurance. Mixed exercises that stretch  Great toe dorsiflexion in with more comfortable exercises with full foot contact to improve tolerance. Patient continued with good tolerance for increases in complexity and difficulty, with continued limitations due to her left toe stiffness and lack of strength/endurance. HEP was updated to include well tolerated exercises that can be replicated at home. Patient reported muscle soreness and no increase in pain at the end of session. Patient would benefit from continued management of limiting condition by skilled physical therapist to address remaining impairments and functional limitations to work towards stated goals and return to PLOF or maximal functional independence.    From  initial PT evaluation on 12/05/2023:  Patient is a 53 y.o. female referred to outpatient physical therapy with a medical diagnosis of Hallux valgus of left foot and congenital metatarsus adductus, left foot who presents with signs and symptoms consistent with decreased ROM across left foot and ankle, antalgic gait, and decreased motor control/coordination of left foot musculature.  Patient is s/p LEFT FOOT BUNION CORRECTION, MET ADDUCTUS CORRECTION WITH JOINT FUSIONS AND BONE CUTS BONE GRAFT FROM HEEL on 08/23/2023.  Patient will benefit from skilled physical therapy intervention to address current body structure impairments and activity limitations to improve function and work towards goals set in current POC in order to return to prior level of function or maximal functional improvement.    OBJECTIVE IMPAIRMENTS: Abnormal gait, decreased activity tolerance, decreased coordination, decreased endurance, decreased knowledge of condition, decreased mobility, difficulty walking, decreased ROM, hypomobility, increased edema, impaired perceived functional ability, impaired flexibility, and obesity.   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  has a past medical history of GERD (gastroesophageal reflux disease), History of 2019 novel coronavirus disease (COVID-19) (04/10/2020), Motion sickness, and Wears contact lenses.  has a past surgical history that includes Dilation and curettage of uterus; Ectopic pregnancy surgery; Esophagogastroduodenoscopy (egd) with propofol (N/A, 02/21/2018); and polypectomy  (02/21/2018).   are also affecting patient's functional outcome.   REHAB POTENTIAL: Excellent  CLINICAL DECISION MAKING: Stable/uncomplicated  EVALUATION COMPLEXITY: Low   GOALS: Goals reviewed with patient? No  SHORT TERM GOALS: Target date: 12/19/2023  Patient will be independent with initial home exercise program for self-management of symptoms. Baseline: Initial HEP provided at IE (12/05/23); participating 30% of the time (12/26/23);  Goal status: In progress   LONG TERM GOALS: Target date: 02/27/2024  Patient will be independent with a long-term home exercise program for self-management of symptoms.  Baseline: Initial HEP provided at IE (12/05/23); participating 30% of the time (12/26/23);  Goal status: In progress  2.  Patient will demonstrate improved FOTO by equal or greater than 10 points by visit #10 to demonstrate improvement in overall condition and self-reported functional ability.  Baseline: To be assessed (12/05/23); 65 at visit #2 (12/26/23);  Goal status: In progress  3.  Patient will complete 20 single leg heel raises on her left foot to allow her to walk with proper gait mechanics and climb hills/embankments at work. Baseline: 10 double leg heel raises (12/05/23); Goal status: In progress  4.  Patient will demonstrate 65 degrees of MTP extension to allow patient to walk and climb stairs without risk of future injury. Baseline: 37 degrees (12/05/23); Goal status: In progress  5.  Patient will demonstrate improvement in Patient Specific Functional Scale (PSFS) of equal or greater than 3 points to reflect clinically significant improvement in patient's most valued functional activities. Baseline: To be assessed at visit 2 (12/05/23); 6 at visit #2 (12/26/23);  Goal status: In progress   PLAN:  PT FREQUENCY: 1-2x/week  PT DURATION: 12 weeks  PLANNED INTERVENTIONS: 97110-Therapeutic exercises, 97530- Therapeutic activity, [id number]- Neuromuscular re-education, 97535-  Self Care, 96045- Manual therapy, [phone]- Gait training, Balance training, Stair training, Dry Needling, Joint mobilization, Joint manipulation, Scar mobilization, Compression bandaging, Cryotherapy, and Moist heat  PLAN FOR NEXT SESSION: Update HEP as appropriate, progressive foot/ankle/toe ROM/strength/proprioception/functional exercises as tolerated. Manual therapy as needed. Education.    Timmi Devora [place], SPT [employer] DPTE   Huntley Dec R. Ilsa Iha, PT, DPT 01/09/24, 5:45 PM  [facility] [address] [place], [place] [address] P: [phone] I F: [phone]

## 2024-01-14 ENCOUNTER — Encounter: Payer: BC Managed Care – PPO | Admitting: Physical Therapy

## 2024-01-16 ENCOUNTER — Encounter: Payer: Self-pay | Admitting: Physical Therapy

## 2024-01-16 ENCOUNTER — Ambulatory Visit: Payer: BC Managed Care – PPO | Admitting: Physical Therapy

## 2024-01-16 DIAGNOSIS — M25672 Stiffness of left ankle, not elsewhere classified: Secondary | ICD-10-CM

## 2024-01-16 DIAGNOSIS — M25675 Stiffness of left foot, not elsewhere classified: Secondary | ICD-10-CM

## 2024-01-16 DIAGNOSIS — M6281 Muscle weakness (generalized): Secondary | ICD-10-CM

## 2024-01-16 DIAGNOSIS — R262 Difficulty in walking, not elsewhere classified: Secondary | ICD-10-CM

## 2024-01-16 NOTE — Therapy (Signed)
OUTPATIENT PHYSICAL THERAPY TREATMENT   Patient Name: [name] [name] MRN: [id number] DOB:07-07-1971, 53 y.o., female Today's Date: 01/16/2024  END OF SESSION:  PT End of Session - 01/16/24 1713     Visit Number 6    Number of Visits 17    Date for PT Re-Evaluation 02/27/24    Authorization Type BLUE [employer] - Reporting period from 12/05/2023    Authorization Time Period 30 per cal yr/30 remain    Authorization - Visit Number 6    Authorization - Number of Visits 30    Progress Note Due on Visit 10    PT Start Time 1603    PT Stop Time 1643    PT Time Calculation (min) 40 min    Activity Tolerance Patient tolerated treatment well;Patient limited by pain    Behavior During Therapy [facility] for tasks assessed/performed                  Past Medical History:  Diagnosis Date   GERD (gastroesophageal reflux disease)    History of 2019 novel coronavirus disease (COVID-19) 04/10/2020   Motion sickness    roller coasters   Wears contact lenses    Past Surgical History:  Procedure Laterality Date   DILATION AND CURETTAGE OF UTERUS     ECTOPIC PREGNANCY SURGERY     ESOPHAGOGASTRODUODENOSCOPY (EGD) WITH PROPOFOL N/A 02/21/2018   Procedure: ESOPHAGOGASTRODUODENOSCOPY (EGD) WITH PROPOFOL;  Surgeon: Midge Minium, MD;  Location: [facility];  Service: Endoscopy;  Laterality: N/A;   POLYPECTOMY  02/21/2018   Procedure: POLYPECTOMY;  Surgeon: Midge Minium, MD;  Location: [facility];  Service: Endoscopy;;   Patient Active Problem List   Diagnosis Date Noted   Heartburn    Gastric polyp    Gastroesophageal reflux disease 09/27/2016   Hiatal hernia 09/27/2016   History of hay fever 10/19/2015   Foot pain 10/19/2015   Breast lump 10/19/2015   H/O gastrointestinal disease 10/19/2015   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 10/19/2015    PCP: Alm Bustard, NP  REFERRING PROVIDER: Edwin Cap, DPM  REFERRING DIAG: Hallux valgus of  left foot; Congenital metatarsus adductus, left foot   THERAPY DIAG:  Stiffness of left foot, not elsewhere classified  Difficulty in walking, not elsewhere classified  Muscle weakness (generalized)  Stiffness of left ankle, not elsewhere classified  Rationale for Evaluation and Treatment: Rehabilitation  ONSET DATE: Surgery on 08/23/2023 LEFT FOOT BUNION CORRECTION, MET ADDUCTUS CORRECTION WITH JOINT FUSIONS AND BONE CUTS BONE GRAFT FROM HEEL  SUBJECTIVE:    PERTINENT HISTORY: Patient is a 53 y.o. female who presents to outpatient physical therapy with a referral for medical diagnosis of Hallux valgus of left foot and congenital metatarsus adductus, left foot. This patient's chief complaints consist of Stiffness across left foot.  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  has a past medical history of GERD (gastroesophageal reflux disease), History of 2019 novel coronavirus disease (COVID-19) (04/10/2020), Motion sickness, and Wears contact lenses.  has a past surgical history that includes Dilation and curettage of uterus; Ectopic pregnancy surgery; Esophagogastroduodenoscopy (egd) with propofol (N/A, 02/21/2018); and polypectomy (02/21/2018).   SUBJECTIVE STATEMENT: Patient arrives to session reporting continued soreness in the top/side of her left great toe joint and some swelling, but no pain. She reports  continuing to complete her HEP in the mornings when her joint feels less stiff. Patient states she felt soreness after last session but no increase in pain. Patient reports she is proud of herself for being able to move her big toe and feels she is progressing overall.   PAIN: Are you having pain? No NPRS: 0/10; soreness on top and side of her left  big toe joint   PRECAUTIONS: No running or high impact activities  PATIENT GOALS: walking normally, feel less stiff, walking on tiptoes   OBJECTIVE  TODAY'S TREATMENT:       Therapeutic exercise: to centralize symptoms and improve ROM, strength, muscular endurance, and activity tolerance required for successful completion of functional activities.   Treadmill 2.9 mph at 0% grade with intermittent UE support. For improved lower extremity mobility, muscular endurance, and weightbearing activity tolerance; and to induce the analgesic effect of aerobic exercise, stimulate improved joint nutrition, and prepare body structures and systems for following interventions. x 5 minutes.   Standing heel raises with double leg stance with forefoot on airex pad (UE support available as needed, but minimally used): 2x20  Therapeutic activities: dynamic activities for functional strengthening and improved functional activity tolerance.  Reverse lunges U UE support as needed, 1x15 L LE back   Lateral lunges (focus on push off)  1x20 each leg   Sled Pushing, 75# (weight of sled) - 70ft x 8   Neuromuscular Re-education: to improve, balance, postural strength, muscle activation patterns, and stabilization strength required for functional activities:  Ball toss with single leg stance balance on firm surface at [employer] with pink ball  2x20 each leg  Forefoot side stepping on airex beam (5 feet)  1x5 each direction  Forward lunges onto BOSU (ball side up)  2x20 each leg    Pt required multimodal cuing for proper technique and to facilitate improved neuromuscular control, strength, range of motion, and functional ability resulting in improved performance and form.   Therex: 8 min Theract: 12 min Neuro: 20 min Manual: 0 min  PATIENT EDUCATION:  Education details: Exercise purpose/form. Self management techniques. Education on diagnosis, prognosis, POC, anatomy and physiology of current  condition. Education on HEP. Person educated: Patient Education method: Explanation, Demonstration, and Verbal cues Education comprehension: verbalized understanding, returned demonstration, and needs further education  HOME EXERCISE PROGRAM: Access Code: [email or web address] URL: [email or web address] Date: 01/16/2024 Prepared by: Murline Weigel [place]  Exercises - Standing Toe Dorsiflexion Stretch  - 1-2 x daily - 1 sets - 20 reps - 5 seconds hold - Seated Anterior Tibialis Stretch  - 1-2 x daily - 1 sets - 20 reps - 5 seconds hold - Toe Yoga - Alternating Great Toe and Lesser Toe Extension  - 7 x weekly - 2 sets - 10 reps - Toe Spreading  - 1 x daily - 2 sets - 10 reps - 4 seconds hold - Standing Heel Raises  - 3 x weekly - 2 sets - 20 reps - Reverse Lunge on Slider  - 3 x weekly - 3 sets - 10 reps - Reverse Lunge  - 3 x weekly - 2 sets - 15 reps - Single Leg Stance  - 3 x weekly - 2 sets - 30 hold - Heel Raise on Step  - 3 x weekly - 2 sets - 20 reps - Lateral Lunge  - 3 x weekly - 2 sets - 20 reps  ASSESSMENT:  CLINICAL IMPRESSION: Patient arrives to session with good tolerance to exercises progressed last session  and continued participation in HEP. Today's session continued to focus on progressing exercises for great toe/foot range of motion, lower extremity strengthening/endurance, and challenging balance for improved activity tolerance. Patient tolerated treatment interventions well and was able to complete all exercises with no increase in pain. Patient required multimodal cuing for proper technique to facilitate improved neuromuscular control, strength, and range of motion resulting in improved performance and form. HEP was updated to include well tolerated exercises. Patient reported feeling good at end of session with slight increase in left big toe joint stiffness and muscle fatigue but no increase in pain. Patient would benefit from continued management of limiting condition by  skilled physical therapist to address remaining impairments and functional limitations to work towards stated goals and return to PLOF or maximal functional independence.   From initial PT evaluation on 12/05/2023:  Patient is a 53 y.o. female referred to outpatient physical therapy with a medical diagnosis of Hallux valgus of left foot and congenital metatarsus adductus, left foot who presents with signs and symptoms consistent with decreased ROM across left foot and ankle, antalgic gait, and decreased motor control/coordination of left foot musculature.  Patient is s/p LEFT FOOT BUNION CORRECTION, MET ADDUCTUS CORRECTION WITH JOINT FUSIONS AND BONE CUTS BONE GRAFT FROM HEEL on 08/23/2023.  Patient will benefit from skilled physical therapy intervention to address current body structure impairments and activity limitations to improve function and work towards goals set in current POC in order to return to prior level of function or maximal functional improvement.    OBJECTIVE IMPAIRMENTS: Abnormal gait, decreased activity tolerance, decreased coordination, decreased endurance, decreased knowledge of condition, decreased mobility, difficulty walking, decreased ROM, hypomobility, increased edema, impaired perceived functional ability, impaired flexibility, and obesity.   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  has a past medical history of GERD (gastroesophageal reflux disease), History of 2019 novel coronavirus disease (COVID-19) (04/10/2020), Motion sickness, and Wears contact lenses.  has a past  surgical history that includes Dilation and curettage of uterus; Ectopic pregnancy surgery; Esophagogastroduodenoscopy (egd) with propofol (N/A, 02/21/2018); and polypectomy (02/21/2018).   are also affecting patient's functional outcome.   REHAB POTENTIAL: Excellent  CLINICAL DECISION MAKING: Stable/uncomplicated  EVALUATION COMPLEXITY: Low   GOALS: Goals reviewed with patient? No  SHORT TERM GOALS: Target date: 12/19/2023  Patient will be independent with initial home exercise program for self-management of symptoms. Baseline: Initial HEP provided at IE (12/05/23); participating 30% of the time (12/26/23);  Goal status: In progress   LONG TERM GOALS: Target date: 02/27/2024  Patient will be independent with a long-term home exercise program for self-management of symptoms.  Baseline: Initial HEP provided at IE (12/05/23); participating 30% of the time (12/26/23);  Goal status: In progress  2.  Patient will demonstrate improved FOTO by equal or greater than 10 points by visit #10 to demonstrate improvement in overall condition and self-reported functional ability.  Baseline: To be assessed (12/05/23); 65 at visit #2 (12/26/23);  Goal status: In progress  3.  Patient will complete 20 single leg heel raises on her left foot to allow her to walk with proper gait mechanics and climb hills/embankments at work. Baseline: 10 double leg heel raises (12/05/23); Goal status:  In progress  4.  Patient will demonstrate 65 degrees of MTP extension to allow patient to walk and climb stairs without risk of future injury. Baseline: 37 degrees (12/05/23); Goal status: In progress  5.  Patient will demonstrate improvement in Patient Specific Functional Scale (PSFS) of equal or greater than 3 points to reflect clinically significant improvement in patient's most valued functional activities. Baseline: To be assessed at visit 2 (12/05/23); 6 at visit #2 (12/26/23);  Goal status: In progress   PLAN:  PT  FREQUENCY: 1-2x/week  PT DURATION: 12 weeks  PLANNED INTERVENTIONS: 97110-Therapeutic exercises, 97530- Therapeutic activity, [id number]- Neuromuscular re-education, 97535- Self Care, [id number]- Manual therapy, [phone]- Gait training, Balance training, Stair training, Dry Needling, Joint mobilization, Joint manipulation, Scar mobilization, Compression bandaging, Cryotherapy, and Moist heat  PLAN FOR NEXT SESSION: Update HEP as appropriate, progressive foot/ankle/toe ROM/strength/proprioception/functional exercises as tolerated. Manual therapy as needed. Education.    Abijah Roussel [place], SPT [employer] DPTE   Huntley Dec R. Ilsa Iha, PT, DPT 01/16/24, 7:14 PM  [facility] [address] [place], [place] [address] P: [phone] I F: [phone]

## 2024-01-21 ENCOUNTER — Encounter: Payer: Self-pay | Admitting: Physical Therapy

## 2024-01-21 ENCOUNTER — Ambulatory Visit: Payer: BC Managed Care – PPO | Admitting: Physical Therapy

## 2024-01-21 DIAGNOSIS — M25672 Stiffness of left ankle, not elsewhere classified: Secondary | ICD-10-CM

## 2024-01-21 DIAGNOSIS — M25675 Stiffness of left foot, not elsewhere classified: Secondary | ICD-10-CM | POA: Diagnosis not present

## 2024-01-21 DIAGNOSIS — R262 Difficulty in walking, not elsewhere classified: Secondary | ICD-10-CM

## 2024-01-21 DIAGNOSIS — M6281 Muscle weakness (generalized): Secondary | ICD-10-CM

## 2024-01-21 NOTE — Therapy (Signed)
OUTPATIENT PHYSICAL THERAPY TREATMENT   Patient Name: [name] [name] MRN: [id number] DOB:26-Mar-1971, 53 y.o., female Today's Date: 01/21/2024  END OF SESSION:  PT End of Session - 01/21/24 1536     Visit Number 7    Number of Visits 17    Date for PT Re-Evaluation 02/27/24    Authorization Type BLUE [employer] - Reporting period from 12/05/2023    Authorization Time Period 30 per cal yr/30 remain    Authorization - Visit Number 7    Authorization - Number of Visits 30    Progress Note Due on Visit 10    PT Start Time 1433    PT Stop Time 1518    PT Time Calculation (min) 45 min    Activity Tolerance Patient tolerated treatment well;Patient limited by pain    Behavior During Therapy [facility] for tasks assessed/performed                   Past Medical History:  Diagnosis Date   GERD (gastroesophageal reflux disease)    History of 2019 novel coronavirus disease (COVID-19) 04/10/2020   Motion sickness    roller coasters   Wears contact lenses    Past Surgical History:  Procedure Laterality Date   DILATION AND CURETTAGE OF UTERUS     ECTOPIC PREGNANCY SURGERY     ESOPHAGOGASTRODUODENOSCOPY (EGD) WITH PROPOFOL N/A 02/21/2018   Procedure: ESOPHAGOGASTRODUODENOSCOPY (EGD) WITH PROPOFOL;  Surgeon: Midge Minium, MD;  Location: [facility];  Service: Endoscopy;  Laterality: N/A;   POLYPECTOMY  02/21/2018   Procedure: POLYPECTOMY;  Surgeon: Midge Minium, MD;  Location: [facility];  Service: Endoscopy;;   Patient Active Problem List   Diagnosis Date Noted   Heartburn    Gastric polyp    Gastroesophageal reflux disease 09/27/2016   Hiatal hernia 09/27/2016   History of hay fever 10/19/2015   Foot pain 10/19/2015   Breast lump 10/19/2015   H/O gastrointestinal disease 10/19/2015   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 10/19/2015    PCP: Alm Bustard, NP  REFERRING PROVIDER: Edwin Cap, DPM  REFERRING DIAG: Hallux valgus of  left foot; Congenital metatarsus adductus, left foot   THERAPY DIAG:  Stiffness of left foot, not elsewhere classified  Difficulty in walking, not elsewhere classified  Muscle weakness (generalized)  Stiffness of left ankle, not elsewhere classified  Rationale for Evaluation and Treatment: Rehabilitation  ONSET DATE: Surgery on 08/23/2023 LEFT FOOT BUNION CORRECTION, MET ADDUCTUS CORRECTION WITH JOINT FUSIONS AND BONE CUTS BONE GRAFT FROM HEEL  SUBJECTIVE:    PERTINENT HISTORY: Patient is a 53 y.o. female who presents to outpatient physical therapy with a referral for medical diagnosis of Hallux valgus of left foot and congenital metatarsus adductus, left foot. This patient's chief complaints consist of Stiffness across left foot.  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  has a past medical history of GERD (gastroesophageal reflux disease), History of 2019 novel coronavirus disease (COVID-19) (04/10/2020), Motion sickness, and Wears contact lenses.  has a past surgical history that includes Dilation and curettage of uterus; Ectopic pregnancy surgery; Esophagogastroduodenoscopy (egd) with propofol (N/A, 02/21/2018); and polypectomy (02/21/2018).   SUBJECTIVE STATEMENT: Patient arrives to session feeling good today with some soreness present in her left great toe joint. Patient reports her soreness is about  the same as last visit but there is no pain. Patient reports continued participation in her HEP and all exercises have been going well. She states she felt soreness after last session for the rest of the evening but improved the following morning.    PAIN: Are you having pain? No NPRS: 0/10; soreness on top and side of her left big toe joint    PRECAUTIONS: No running or high impact activities  PATIENT GOALS: walking normally, feel less stiff, walking on tiptoes   OBJECTIVE  TODAY'S TREATMENT:       Therapeutic exercise: to centralize symptoms and improve ROM, strength, muscular endurance, and activity tolerance required for successful completion of functional activities.   Treadmill 2.9 mph at 0% grade with intermittent UE support. For improved lower extremity mobility, muscular endurance, and weightbearing activity tolerance; and to induce the analgesic effect of aerobic exercise, stimulate improved joint nutrition, and prepare body structures and systems for following interventions. x 5 minutes.   Runners step up with UE support as needed  12 inch step, 1x20 each leg     17 inch grey chair, 1x20 each side     Squats with heels elevated on 2x4 plank     1x20   Education on HEP including handout   Therapeutic activities: dynamic activities for functional strengthening and improved functional activity tolerance.  Sled Pushing, 75# (weight of sled) - 48ft x 8   Neuromuscular Re-education: to improve, balance, postural strength, muscle activation patterns, and stabilization strength required for functional activities:  Forefoot side stepping with heels up on airex beam (5 feet)  1x5 each direction  Forward forefoot walking with heels up on airex beam (5 feet)   1x5 each direction   Forward lunges onto BOSU (ball side up)  1x20 each leg    Pt required multimodal cuing for proper technique and to facilitate improved neuromuscular control, strength, range of motion, and functional ability resulting in improved performance and form.    PATIENT EDUCATION:  Education details: Exercise purpose/form. Self management techniques. Education on diagnosis, prognosis, POC, anatomy and physiology of current condition. Education on HEP. Person educated: Patient Education method: Explanation, Demonstration, and Verbal  cues Education comprehension: verbalized understanding, returned demonstration, and needs further education  HOME EXERCISE PROGRAM: Access Code: [email or web address] URL: [email or web address] Date: 01/21/2024 Prepared by: Bernarda Erck [place]  Exercises - Standing Toe Dorsiflexion Stretch  - 1-2 x daily - 1 sets - 20 reps - 5 seconds hold - Seated Anterior Tibialis Stretch  - 1-2 x daily - 1 sets - 20 reps - 5 seconds hold - Toe Yoga - Alternating Great Toe and Lesser Toe Extension  - 7 x weekly - 2 sets - 10 reps - Toe Spreading  - 1 x daily - 2 sets - 10 reps - 4 seconds hold - Standing Heel Raises  - 3 x weekly - 2 sets - 20 reps - Reverse Lunge on Slider  - 3 x weekly - 3 sets - 10 reps - Reverse Lunge  - 3 x weekly - 2 sets - 15 reps - Single Leg Stance  - 3 x weekly - 2 sets - 30 hold - Heel Raise on Step  - 3 x weekly - 2 sets - 20 reps - Lateral Lunge  - 3 x weekly - 2 sets - 20 reps - Forward Fall Out Lunge  - 3 x weekly - 2 sets - 20 reps  ASSESSMENT:  CLINICAL IMPRESSION: Patient arrives to session with continued good  tolerance to exercise progressions. The focus of today's session was to continue progressing exercises to increase great toe/foot range of motion, lower extremity strengthening, and challenge balance/endurance for improved activity tolerance and participation in meaningful functional activities. Patient tolerated treatment interventions well and was able to complete all exercises with increased soreness but no increase in pain. Patient provided with updated HEP to include forward lunges to improve foot/ankle stability and strength. Patient advised to try performing stretches in the evening to see if that is helpful for relieving stiffness after a full day of activities. Patient reports slight increase in left big toe joint stiffness and muscle fatigue but no increase in pain at end of session. Patient would benefit from continued management of limiting condition by  skilled physical therapist to address remaining impairments and functional limitations to work towards stated goals and return to PLOF or maximal functional independence.   From initial PT evaluation on 12/05/2023:  Patient is a 53 y.o. female referred to outpatient physical therapy with a medical diagnosis of Hallux valgus of left foot and congenital metatarsus adductus, left foot who presents with signs and symptoms consistent with decreased ROM across left foot and ankle, antalgic gait, and decreased motor control/coordination of left foot musculature.  Patient is s/p LEFT FOOT BUNION CORRECTION, MET ADDUCTUS CORRECTION WITH JOINT FUSIONS AND BONE CUTS BONE GRAFT FROM HEEL on 08/23/2023.  Patient will benefit from skilled physical therapy intervention to address current body structure impairments and activity limitations to improve function and work towards goals set in current POC in order to return to prior level of function or maximal functional improvement.    OBJECTIVE IMPAIRMENTS: Abnormal gait, decreased activity tolerance, decreased coordination, decreased endurance, decreased knowledge of condition, decreased mobility, difficulty walking, decreased ROM, hypomobility, increased edema, impaired perceived functional ability, impaired flexibility, and obesity.   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  has a past medical history of GERD (gastroesophageal reflux disease), History of 2019 novel coronavirus disease (COVID-19) (04/10/2020), Motion sickness, and Wears contact lenses.  has a past  surgical history that includes Dilation and curettage of uterus; Ectopic pregnancy surgery; Esophagogastroduodenoscopy (egd) with propofol (N/A, 02/21/2018); and polypectomy (02/21/2018).   are also affecting patient's functional outcome.   REHAB POTENTIAL: Excellent  CLINICAL DECISION MAKING: Stable/uncomplicated  EVALUATION COMPLEXITY: Low   GOALS: Goals reviewed with patient? No  SHORT TERM GOALS: Target date: 12/19/2023  Patient will be independent with initial home exercise program for self-management of symptoms. Baseline: Initial HEP provided at IE (12/05/23); participating 30% of the time (12/26/23);  Goal status: In progress   LONG TERM GOALS: Target date: 02/27/2024  Patient will be independent with a long-term home exercise program for self-management of symptoms.  Baseline: Initial HEP provided at IE (12/05/23); participating 30% of the time (12/26/23);  Goal status: In progress  2.  Patient will demonstrate improved FOTO by equal or greater than 10 points by visit #10 to demonstrate improvement in overall condition and self-reported functional ability.  Baseline: To be assessed (12/05/23); 65 at visit #2 (12/26/23);  Goal status: In progress  3.  Patient will complete 20 single leg heel raises on her left foot to allow her to walk with proper  gait mechanics and climb hills/embankments at work. Baseline: 10 double leg heel raises (12/05/23); Goal status: In progress  4.  Patient will demonstrate 65 degrees of MTP extension to allow patient to walk and climb stairs without risk of future injury. Baseline: 37 degrees (12/05/23); Goal status: In progress  5.  Patient will demonstrate improvement in Patient Specific Functional Scale (PSFS) of equal or greater than 3 points to reflect clinically significant improvement in patient's most valued functional activities. Baseline: To be assessed at visit 2 (12/05/23); 6 at visit #2 (12/26/23);  Goal status: In progress   PLAN:  PT  FREQUENCY: 1-2x/week  PT DURATION: 12 weeks  PLANNED INTERVENTIONS: 97110-Therapeutic exercises, 97530- Therapeutic activity, [id number]- Neuromuscular re-education, 97535- Self Care, 56433- Manual therapy, [phone]- Gait training, Balance training, Stair training, Dry Needling, Joint mobilization, Joint manipulation, Scar mobilization, Compression bandaging, Cryotherapy, and Moist heat  PLAN FOR NEXT SESSION: Update HEP as appropriate, progressive foot/ankle/toe ROM/strength/proprioception/functional exercises as tolerated. Manual therapy as needed. Education. Measure progress and update HEP for possible more extended time away from PT due to scheduling limitations.    Mikaya Bunner [place], SPT [employer] DPTE   Huntley Dec R. Ilsa Iha, PT, DPT 01/21/24, 4:40 PM  [facility] [address] [place], [place] [address] P: [phone] I F: [phone]

## 2024-01-23 ENCOUNTER — Ambulatory Visit: Payer: BC Managed Care – PPO | Admitting: Physical Therapy

## 2024-01-23 ENCOUNTER — Encounter: Payer: Self-pay | Admitting: Physical Therapy

## 2024-01-23 DIAGNOSIS — M25672 Stiffness of left ankle, not elsewhere classified: Secondary | ICD-10-CM

## 2024-01-23 DIAGNOSIS — M25675 Stiffness of left foot, not elsewhere classified: Secondary | ICD-10-CM | POA: Diagnosis not present

## 2024-01-23 DIAGNOSIS — R262 Difficulty in walking, not elsewhere classified: Secondary | ICD-10-CM

## 2024-01-23 DIAGNOSIS — M6281 Muscle weakness (generalized): Secondary | ICD-10-CM

## 2024-01-23 NOTE — Therapy (Signed)
OUTPATIENT PHYSICAL THERAPY TREATMENT   Patient Name: [name] [name] MRN: [id number] DOB:1971/10/20, 53 y.o., female Today's Date: 01/23/2024  END OF SESSION:  PT End of Session - 01/23/24 1525     Visit Number 8    Number of Visits 17    Date for PT Re-Evaluation 02/27/24    Authorization Type BLUE [employer] - Reporting period from 12/05/2023    Authorization Time Period 30 per cal yr/30 remain    Authorization - Visit Number 8    Authorization - Number of Visits 30    Progress Note Due on Visit 10    PT Start Time 1434    PT Stop Time 1516    PT Time Calculation (min) 42 min    Activity Tolerance Patient tolerated treatment well;Patient limited by pain    Behavior During Therapy [facility] for tasks assessed/performed                    Past Medical History:  Diagnosis Date   GERD (gastroesophageal reflux disease)    History of 2019 novel coronavirus disease (COVID-19) 04/10/2020   Motion sickness    roller coasters   Wears contact lenses    Past Surgical History:  Procedure Laterality Date   DILATION AND CURETTAGE OF UTERUS     ECTOPIC PREGNANCY SURGERY     ESOPHAGOGASTRODUODENOSCOPY (EGD) WITH PROPOFOL N/A 02/21/2018   Procedure: ESOPHAGOGASTRODUODENOSCOPY (EGD) WITH PROPOFOL;  Surgeon: Midge Minium, MD;  Location: [facility];  Service: Endoscopy;  Laterality: N/A;   POLYPECTOMY  02/21/2018   Procedure: POLYPECTOMY;  Surgeon: Midge Minium, MD;  Location: [facility];  Service: Endoscopy;;   Patient Active Problem List   Diagnosis Date Noted   Heartburn    Gastric polyp    Gastroesophageal reflux disease 09/27/2016   Hiatal hernia 09/27/2016   History of hay fever 10/19/2015   Foot pain 10/19/2015   Breast lump 10/19/2015   H/O gastrointestinal disease 10/19/2015   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 10/19/2015    PCP: Alm Bustard, NP  REFERRING PROVIDER: Edwin Cap, DPM  REFERRING DIAG: Hallux valgus  of left foot; Congenital metatarsus adductus, left foot   THERAPY DIAG:  Stiffness of left foot, not elsewhere classified  Difficulty in walking, not elsewhere classified  Muscle weakness (generalized)  Stiffness of left ankle, not elsewhere classified  Rationale for Evaluation and Treatment: Rehabilitation  ONSET DATE: Surgery on 08/23/2023 LEFT FOOT BUNION CORRECTION, MET ADDUCTUS CORRECTION WITH JOINT FUSIONS AND BONE CUTS BONE GRAFT FROM HEEL  SUBJECTIVE:    PERTINENT HISTORY: Patient is a 53 y.o. female who presents to outpatient physical therapy with a referral for medical diagnosis of Hallux valgus of left foot and congenital metatarsus adductus, left foot. This patient's chief complaints consist of Stiffness across left foot.  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  has a past medical history of GERD (gastroesophageal reflux disease), History of 2019 novel coronavirus disease (COVID-19) (04/10/2020), Motion sickness, and Wears contact lenses.  has a past surgical history that includes Dilation and curettage of uterus; Ectopic pregnancy surgery; Esophagogastroduodenoscopy (egd) with propofol (N/A, 02/21/2018); and polypectomy (02/21/2018).   SUBJECTIVE STATEMENT: Patient arrives to session reporting increased soreness in her quads/legs and left foot after last session that lasted about a day. Patient  states she feels things are progressing but is curious if her toe will ever feel normal. She reports her swelling is improving overall and great toe joint does not get as swollen. Patient reports she did her stretches at work and it helped improve the stiffness.    PAIN: Are you having pain? No NPRS: 0/10; soreness on top and side of her left big  toe joint   PRECAUTIONS: No running or high impact activities  PATIENT GOALS: walking normally, feel less stiff, walking on tiptoes   OBJECTIVE  TODAY'S TREATMENT:       Therapeutic exercise: to centralize symptoms and improve ROM, strength, muscular endurance, and activity tolerance required for successful completion of functional activities.   Treadmill 2.9-3 mph at 0% grade with intermittent UE support. For improved lower extremity mobility, muscular endurance, and weightbearing activity tolerance; and to induce the analgesic effect of aerobic exercise, stimulate improved joint nutrition, and prepare body structures and systems for following interventions. x 7 minutes.   Runners step up with UE support as needed     17 inch grey chair, 1x20 each side     Education on HEP including handout   Therapeutic activities: dynamic activities for functional strengthening and improved functional activity tolerance.  Sled Pushing, 75# + 50# (two 25# weight plates) - 16XW x 8   Squats on BOSU (ball side down)   2x15  Neuromuscular Re-education: to improve, balance, postural strength, muscle activation patterns, and stabilization strength required for functional activities:  Forefoot side stepping with heels up on airex beam (5 feet)  1x5 each direction  Forward forefoot walking with heels up on airex beam (5 feet)   1x5 each direction   Forward lunges onto BOSU (ball side up)  1x20 each leg   Single Leg Stance on Airex pad with ball toss on rebounder   1x20 tosses each leg    Pt required multimodal cuing for proper technique and to facilitate improved neuromuscular control, strength, range of motion, and functional ability resulting in improved performance and form.      PATIENT EDUCATION:  Education details: Exercise purpose/form. Self management techniques. Education on diagnosis, prognosis, POC, anatomy and physiology of current condition. Education on HEP. Person educated:  Patient Education method: Explanation, Demonstration, and Verbal cues Education comprehension: verbalized understanding, returned demonstration, and needs further education  HOME EXERCISE PROGRAM: Access Code: [email or web address] URL: [email or web address] Date: 01/23/2024 Prepared by: Hazelee Harbold [place]  Exercises - Standing Toe Dorsiflexion Stretch  - 1-2 x daily - 1 sets - 20 reps - 5 seconds hold - Seated Anterior Tibialis Stretch  - 1-2 x daily - 1 sets - 20 reps - 5 seconds hold - Toe Yoga - Alternating Great Toe and Lesser Toe Extension  - 7 x weekly - 2 sets - 10 reps - Toe Spreading  - 1 x daily - 2 sets - 10 reps - 4 seconds hold - Standing Heel Raises  - 3 x weekly - 2 sets - 20 reps - Reverse Lunge  - 3 x weekly - 2 sets - 15 reps - Single Leg Stance  - 3 x weekly - 2 sets - 30 hold - Heel Raise on Step  - 3 x weekly - 2 sets - 20 reps - Lateral Lunge  - 3 x weekly - 2 sets - 20 reps - Forward Fall Out Lunge  - 3 x weekly - 2 sets - 20 reps - Squat  - 3 x weekly - 2 sets - 15  reps  ASSESSMENT:  CLINICAL IMPRESSION: Patient arrives to session with mostly resolved leg and foot soreness following exercise progressions from last session. The focus of today's session was to continue progressing exercises to increase great toe/foot range of motion, lower extremity strength, and balance to improve tolerance to functional activities. Patient tolerated treatment interventions well and was able to complete all exercises with no increase in pain. Patient provided with updated HEP to include squats to increase foot/ankle range of motion and lower extremity strength. Patient reports muscle soreness/fatigue but no increase in pain in left great toe joint at end of session. Patient would benefit from continued management of limiting condition by skilled physical therapist to address remaining impairments and functional limitations to work towards stated goals and return to PLOF or maximal  functional independence.   From initial PT evaluation on 12/05/2023:  Patient is a 53 y.o. female referred to outpatient physical therapy with a medical diagnosis of Hallux valgus of left foot and congenital metatarsus adductus, left foot who presents with signs and symptoms consistent with decreased ROM across left foot and ankle, antalgic gait, and decreased motor control/coordination of left foot musculature.  Patient is s/p LEFT FOOT BUNION CORRECTION, MET ADDUCTUS CORRECTION WITH JOINT FUSIONS AND BONE CUTS BONE GRAFT FROM HEEL on 08/23/2023.  Patient will benefit from skilled physical therapy intervention to address current body structure impairments and activity limitations to improve function and work towards goals set in current POC in order to return to prior level of function or maximal functional improvement.    OBJECTIVE IMPAIRMENTS: Abnormal gait, decreased activity tolerance, decreased coordination, decreased endurance, decreased knowledge of condition, decreased mobility, difficulty walking, decreased ROM, hypomobility, increased edema, impaired perceived functional ability, impaired flexibility, and obesity.   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  has a past medical history of GERD (gastroesophageal reflux disease), History of 2019 novel coronavirus disease (COVID-19) (04/10/2020), Motion sickness, and Wears contact lenses.  has a past surgical history that includes Dilation and curettage of uterus; Ectopic pregnancy surgery; Esophagogastroduodenoscopy (egd) with propofol (N/A,  02/21/2018); and polypectomy (02/21/2018).   are also affecting patient's functional outcome.   REHAB POTENTIAL: Excellent  CLINICAL DECISION MAKING: Stable/uncomplicated  EVALUATION COMPLEXITY: Low   GOALS: Goals reviewed with patient? No  SHORT TERM GOALS: Target date: 12/19/2023  Patient will be independent with initial home exercise program for self-management of symptoms. Baseline: Initial HEP provided at IE (12/05/23); participating 30% of the time (12/26/23);  Goal status: In progress   LONG TERM GOALS: Target date: 02/27/2024  Patient will be independent with a long-term home exercise program for self-management of symptoms.  Baseline: Initial HEP provided at IE (12/05/23); participating 30% of the time (12/26/23);  Goal status: In progress  2.  Patient will demonstrate improved FOTO by equal or greater than 10 points by visit #10 to demonstrate improvement in overall condition and self-reported functional ability.  Baseline: To be assessed (12/05/23); 65 at visit #2 (12/26/23);  Goal status: In progress  3.  Patient will complete 20 single leg heel raises on her left foot to allow her to walk with proper gait mechanics and climb hills/embankments at work. Baseline: 10 double leg heel raises (12/05/23);  Goal status: In progress  4.  Patient will demonstrate 65 degrees of MTP extension to allow patient to walk and climb stairs without risk of future injury. Baseline: 37 degrees (12/05/23); Goal status: In progress  5.  Patient will demonstrate improvement in Patient Specific Functional Scale (PSFS) of equal or greater than 3 points to reflect clinically significant improvement in patient's most valued functional activities. Baseline: To be assessed at visit 2 (12/05/23); 6 at visit #2 (12/26/23);  Goal status: In progress   PLAN:  PT FREQUENCY: 1-2x/week  PT DURATION: 12 weeks  PLANNED INTERVENTIONS: 97110-Therapeutic exercises, 97530- Therapeutic activity, [id number]  Neuromuscular re-education, 97535- Self Care, 87564- Manual therapy, [id number]- Gait training, Balance training, Stair training, Dry Needling, Joint mobilization, Joint manipulation, Scar mobilization, Compression bandaging, Cryotherapy, and Moist heat  PLAN FOR NEXT SESSION: Update HEP as appropriate, progressive foot/ankle/toe ROM/strength/proprioception/functional exercises as tolerated. Manual therapy as needed. Education. Measure progress and update HEP for possible more extended time away from PT due to scheduling limitations.    Sanyia Dini [place], SPT [employer] DPTE   Huntley Dec R. Ilsa Iha, PT, DPT 01/23/24, 8:11 PM  [facility] [address] [place], [place] [address] P: [phone] I F: [phone]

## 2024-01-29 ENCOUNTER — Encounter: Payer: BC Managed Care – PPO | Admitting: Physical Therapy

## 2024-01-30 ENCOUNTER — Ambulatory Visit: Payer: BC Managed Care – PPO | Attending: Podiatry | Admitting: Physical Therapy

## 2024-01-30 ENCOUNTER — Encounter: Payer: Self-pay | Admitting: Physical Therapy

## 2024-01-30 DIAGNOSIS — M25672 Stiffness of left ankle, not elsewhere classified: Secondary | ICD-10-CM | POA: Insufficient documentation

## 2024-01-30 DIAGNOSIS — M25675 Stiffness of left foot, not elsewhere classified: Secondary | ICD-10-CM | POA: Diagnosis present

## 2024-01-30 DIAGNOSIS — M6281 Muscle weakness (generalized): Secondary | ICD-10-CM | POA: Insufficient documentation

## 2024-01-30 DIAGNOSIS — R262 Difficulty in walking, not elsewhere classified: Secondary | ICD-10-CM | POA: Insufficient documentation

## 2024-01-30 NOTE — Therapy (Addendum)
 OUTPATIENT PHYSICAL THERAPY TREATMENT   Patient Name: [name] [name] MRN: [id number] DOB:[birth year], 53 y.o., female Today's Date: 01/30/2024  END OF SESSION:  PT End of Session - 01/30/24 1609     Visit Number 9    Number of Visits 17    Date for PT Re-Evaluation 02/27/24    Authorization Type BLUE [employer] - Reporting period from 12/05/2023    Authorization Time Period 30 per cal yr/30 remain    Authorization - Visit Number 9    Authorization - Number of Visits 30    Progress Note Due on Visit 10    PT Start Time 1518    PT Stop Time 1559    PT Time Calculation (min) 41 min    Activity Tolerance Patient tolerated treatment well;Patient limited by pain    Behavior During Therapy [facility] for tasks assessed/performed                Past Medical History:  Diagnosis Date   GERD (gastroesophageal reflux disease)    History of 2019 novel coronavirus disease (COVID-19) 04/10/2020   Motion sickness    roller coasters   Wears contact lenses    Past Surgical History:  Procedure Laterality Date   DILATION AND CURETTAGE OF UTERUS     ECTOPIC PREGNANCY SURGERY     ESOPHAGOGASTRODUODENOSCOPY (EGD) WITH PROPOFOL  N/A 02/21/2018   Procedure: ESOPHAGOGASTRODUODENOSCOPY (EGD) WITH PROPOFOL ;  Surgeon: Jinny Carmine, MD;  Location: [facility];  Service: Endoscopy;  Laterality: N/A;   POLYPECTOMY  02/21/2018   Procedure: POLYPECTOMY;  Surgeon: Jinny Carmine, MD;  Location: [facility];  Service: Endoscopy;;   Patient Active Problem List   Diagnosis Date Noted   Heartburn    Gastric polyp    Gastroesophageal reflux disease 09/27/2016   Hiatal hernia 09/27/2016   History of hay fever 10/19/2015   Foot pain 10/19/2015   Breast lump 10/19/2015   H/O gastrointestinal disease 10/19/2015   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 10/19/2015    PCP: Harvey Gaetana CROME, NP  REFERRING PROVIDER: Silva Juliene SAUNDERS, DPM  REFERRING DIAG: Hallux valgus of left  foot; Congenital metatarsus adductus, left foot   THERAPY DIAG:  Stiffness of left foot, not elsewhere classified  Muscle weakness (generalized)  Stiffness of left ankle, not elsewhere classified  Difficulty in walking, not elsewhere classified  Rationale for Evaluation and Treatment: Rehabilitation  ONSET DATE: Surgery on 08/23/2023 LEFT FOOT BUNION CORRECTION, MET ADDUCTUS CORRECTION WITH JOINT FUSIONS AND BONE CUTS BONE GRAFT FROM HEEL  SUBJECTIVE:    PERTINENT HISTORY: Patient is a 53 y.o. female who presents to outpatient physical therapy with a referral for medical diagnosis of Hallux valgus of left foot and congenital metatarsus adductus, left foot. This patient's chief complaints consist of Stiffness across left foot.  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  has a past medical history of GERD (gastroesophageal reflux disease), History of 2019 novel coronavirus disease (COVID-19) (04/10/2020), Motion sickness, and Wears contact lenses.  has a past surgical history that includes Dilation and curettage of uterus; Ectopic pregnancy surgery; Esophagogastroduodenoscopy (egd) with propofol  (N/A, 02/21/2018); and polypectomy (02/21/2018).   SUBJECTIVE STATEMENT: Feeling okay today, about the same as last time No pain but continued stiffness in left great toe joint Went for a hike on a trail  on Saturday - it was a flat trail but definitely felt it in big toe/foot Unsure of how long she walked because she didn't finish the whole trail  Swelling is getting better  Took compression sock off for a couple days just to see and the swelling didn't get too bad Goes back to doctor at end of this month - wants to ask if she should discontinue use of compression  sock  Didn't do HEP over the weekend due to the busy weekend she had but has done them the last couple days - exercises still felt good   PAIN: Are you having pain? No NPRS: 0/10; just stiffness in left big toe joint   PRECAUTIONS: No running or high impact activities  PATIENT GOALS: walking normally, feel less stiff, walking on tiptoes   OBJECTIVE  TODAY'S TREATMENT:       Therapeutic exercise: to centralize symptoms and improve ROM, strength, muscular endurance, and activity tolerance required for successful completion of functional activities.   Treadmill 2.9 mph at 0% grade with intermittent UE support. For improved lower extremity mobility, muscular endurance, and weightbearing activity tolerance; and to induce the analgesic effect of aerobic exercise, stimulate improved joint nutrition, and prepare body structures and systems for following interventions. x 5 minutes.    Therapeutic activities: dynamic therapeutic activities incorporating MULTIPLE parameters or areas of the body designed to achieve improved functional performance.  Runners step up with UE support as needed to improve ability to complete hiking and work towards push of/landing needed for running or jumping    17 inch grey chair, 1x20 each side   Sled Pushing, 75# + 50# (two 25# weight plates) - 69qu x 8   Squats on BOSU (ball side down)   2x20  Neuromuscular Re-education: to improve, balance, postural strength, muscle activation patterns, and stabilization strength required for functional activities:  Forefoot side stepping with heels up on airex beam (5 feet)  1x5 each direction  Forward forefoot walking with heels up on airex beam (5 feet)   1x5 each direction   Backward walking on airex beam (5 feet)   1x5 each direction   Forward lunges onto BOSU (ball side up)  2x20 each leg   Single Leg Stance on Airex pad with ball toss on rebounder   2x20 tosses each leg    Pt required multimodal cuing for  proper technique and to facilitate improved neuromuscular control, strength, range of motion, and functional ability resulting in improved performance and form.   PATIENT EDUCATION:  Education details: Exercise purpose/form. Self management techniques. Education on diagnosis, prognosis, POC, anatomy and physiology of current condition. Education on HEP. Person educated: Patient Education method: Explanation, Demonstration, and Verbal cues Education comprehension: verbalized understanding, returned demonstration, and needs further education  HOME EXERCISE PROGRAM: Access Code: [email or web address] URL: [email or web address] Date: 01/23/2024 Prepared by: Cathline Dowen  Exercises - Standing Toe Dorsiflexion Stretch  - 1-2 x daily - 1 sets - 20 reps - 5 seconds hold - Seated Anterior Tibialis Stretch  - 1-2 x daily - 1 sets - 20 reps - 5 seconds hold - Toe Yoga - Alternating Great Toe and Lesser Toe Extension  - 7 x weekly - 2 sets - 10 reps - Toe Spreading  - 1 x daily - 2 sets - 10 reps - 4 seconds hold - Standing Heel Raises  - 3 x weekly - 2 sets - 20 reps - Reverse Lunge  - 3 x weekly - 2 sets - 15  reps - Single Leg Stance  - 3 x weekly - 2 sets - 30 hold - Heel Raise on Step  - 3 x weekly - 2 sets - 20 reps - Lateral Lunge  - 3 x weekly - 2 sets - 20 reps - Forward Fall Out Lunge  - 3 x weekly - 2 sets - 20 reps - Squat  - 3 x weekly - 2 sets - 15 reps   ASSESSMENT:  CLINICAL IMPRESSION: Patient arrives to session with continued stiffness in left great toe joint. The focus of today's session was to continue progressing exercises to improve great toe/foot range of motion, increase lower extremity strength/endurance, and challenge balance. Patient tolerated interventions well and was able to complete exercises with increased soreness but no increase in pain. Patient advised to continue with same HEP from last session due to not being able to do exercises as regularly since last  session. Patient reports feeling tired at end of session and stiffness/soreness in left great toe joint but no increase in pain at end of session. Patient would benefit from continued management of limiting condition by skilled physical therapist to address remaining impairments and functional limitations to work towards stated goals and return to PLOF or maximal functional independence.   From initial PT evaluation on 12/05/2023:  Patient is a 53 y.o. female referred to outpatient physical therapy with a medical diagnosis of Hallux valgus of left foot and congenital metatarsus adductus, left foot who presents with signs and symptoms consistent with decreased ROM across left foot and ankle, antalgic gait, and decreased motor control/coordination of left foot musculature.  Patient is s/p LEFT FOOT BUNION CORRECTION, MET ADDUCTUS CORRECTION WITH JOINT FUSIONS AND BONE CUTS BONE GRAFT FROM HEEL on 08/23/2023.  Patient will benefit from skilled physical therapy intervention to address current body structure impairments and activity limitations to improve function and work towards goals set in current POC in order to return to prior level of function or maximal functional improvement.    OBJECTIVE IMPAIRMENTS: Abnormal gait, decreased activity tolerance, decreased coordination, decreased endurance, decreased knowledge of condition, decreased mobility, difficulty walking, decreased ROM, hypomobility, increased edema, impaired perceived functional ability, impaired flexibility, and obesity.   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  has a past medical history of GERD (gastroesophageal reflux disease), History of 2019 novel coronavirus disease (COVID-19) (04/10/2020), Motion sickness, and Wears contact lenses.  has a past surgical history that includes Dilation and curettage of uterus; Ectopic pregnancy surgery; Esophagogastroduodenoscopy (egd) with propofol  (N/A, 02/21/2018); and polypectomy (02/21/2018).   are also affecting patient's functional outcome.   REHAB POTENTIAL: Excellent  CLINICAL DECISION MAKING: Stable/uncomplicated  EVALUATION COMPLEXITY: Low   GOALS: Goals reviewed with patient? No  SHORT TERM GOALS: Target date: 12/19/2023  Patient will be independent with initial home exercise program for self-management of symptoms. Baseline: Initial HEP provided at IE (12/05/23); participating 30% of the time (12/26/23);  Goal status: In progress   LONG TERM GOALS: Target date: 02/27/2024  Patient will be independent with a long-term home exercise program for self-management of symptoms.  Baseline: Initial HEP provided at IE (12/05/23); participating 30% of the time (12/26/23);  Goal status: In progress  2.  Patient will demonstrate improved FOTO by equal or greater than 10 points by visit #10 to demonstrate improvement in overall condition and self-reported functional ability.  Baseline: To be assessed (12/05/23); 65 at visit #2 (12/26/23);  Goal status: In progress  3.  Patient will complete 20 single leg heel raises on her left foot to allow her to walk with proper gait mechanics and climb hills/embankments at work. Baseline: 10 double leg heel raises (12/05/23); Goal status: In progress  4.  Patient will demonstrate 65 degrees of MTP extension to allow patient to walk and climb stairs without risk of future injury. Baseline: 37 degrees (12/05/23); Goal status: In progress  5.  Patient will demonstrate improvement in Patient Specific Functional Scale (PSFS) of equal or greater  than 3 points to reflect clinically significant improvement in patient's most valued functional activities. Baseline: To be assessed at visit 2 (12/05/23); 6 at visit #2 (12/26/23);  Goal status: In progress   PLAN:  PT FREQUENCY: 1-2x/week  PT DURATION: 12 weeks  PLANNED INTERVENTIONS: 97110-Therapeutic exercises, 97530- Therapeutic activity, [id number]- Neuromuscular re-education, 97535- Self Care, [id number]- Manual therapy, [phone]- Gait training, Balance training, Stair training, Dry Needling, Joint mobilization, Joint manipulation, Scar mobilization, Compression bandaging, Cryotherapy, and Moist heat  PLAN FOR NEXT SESSION: Update HEP as appropriate, progressive foot/ankle/toe ROM/strength/proprioception/functional exercises as tolerated. Manual therapy as needed. Education. Measure progress and update HEP for possible more extended time away from PT due to scheduling limitations.    Mariam Helbert, SPT [employer] DPTE   Camie R. Juli, PT, DPT 01/30/24, 6:32 PM  [facility] [address] [place], [place] [address] P: [phone] I F: [phone]

## 2024-02-03 ENCOUNTER — Encounter: Payer: Self-pay | Admitting: Physical Therapy

## 2024-02-03 ENCOUNTER — Ambulatory Visit: Payer: BC Managed Care – PPO | Admitting: Physical Therapy

## 2024-02-03 DIAGNOSIS — R262 Difficulty in walking, not elsewhere classified: Secondary | ICD-10-CM

## 2024-02-03 DIAGNOSIS — M25672 Stiffness of left ankle, not elsewhere classified: Secondary | ICD-10-CM

## 2024-02-03 DIAGNOSIS — M6281 Muscle weakness (generalized): Secondary | ICD-10-CM

## 2024-02-03 DIAGNOSIS — M25675 Stiffness of left foot, not elsewhere classified: Secondary | ICD-10-CM | POA: Diagnosis not present

## 2024-02-03 NOTE — Therapy (Signed)
OUTPATIENT PHYSICAL THERAPY PROGRESS NOTE / TREATMENT Reporting from 12/05/23 - 02/03/24   Patient Name: [name] [name] MRN: [id number] DOB:1971-06-15, 53 y.o., female Today's Date: 02/03/2024  END OF SESSION:  PT End of Session - 02/03/24 1618     Visit Number 10    Number of Visits 17    Date for PT Re-Evaluation 02/27/24    Authorization Type BLUE CROSS BLUE [employer]IELD - Reporting period from 12/05/2023    Authorization Time Period 30 per cal yr/30 remain    Authorization - Visit Number 10    Authorization - Number of Visits 30    Progress Note Due on Visit 10    PT Start Time 1523    PT Stop Time 1555    PT Time Calculation (min) 32 min    Activity Tolerance Patient tolerated treatment well    Behavior During Therapy [facility] for tasks assessed/performed                 Past Medical History:  Diagnosis Date   GERD (gastroesophageal reflux disease)    History of 2019 novel coronavirus disease (COVID-19) 04/10/2020   Motion sickness    roller coasters   Wears contact lenses    Past Surgical History:  Procedure Laterality Date   DILATION AND CURETTAGE OF UTERUS     ECTOPIC PREGNANCY SURGERY     ESOPHAGOGASTRODUODENOSCOPY (EGD) WITH PROPOFOL N/A 02/21/2018   Procedure: ESOPHAGOGASTRODUODENOSCOPY (EGD) WITH PROPOFOL;  Surgeon: Midge Minium, MD;  Location: [facility];  Service: Endoscopy;  Laterality: N/A;   POLYPECTOMY  02/21/2018   Procedure: POLYPECTOMY;  Surgeon: Midge Minium, MD;  Location: [facility];  Service: Endoscopy;;   Patient Active Problem List   Diagnosis Date Noted   Heartburn    Gastric polyp    Gastroesophageal reflux disease 09/27/2016   Hiatal hernia 09/27/2016   History of hay fever 10/19/2015   Foot pain 10/19/2015   Breast lump 10/19/2015   H/O gastrointestinal disease 10/19/2015   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 10/19/2015    PCP: Alm Bustard, NP  REFERRING PROVIDER: Edwin Cap,  DPM  REFERRING DIAG: Hallux valgus of left foot; Congenital metatarsus adductus, left foot   THERAPY DIAG:  Stiffness of left foot, not elsewhere classified  Stiffness of left ankle, not elsewhere classified  Muscle weakness (generalized)  Difficulty in walking, not elsewhere classified  Rationale for Evaluation and Treatment: Rehabilitation  ONSET DATE: Surgery on 08/23/2023 LEFT FOOT BUNION CORRECTION, MET ADDUCTUS CORRECTION WITH JOINT FUSIONS AND BONE CUTS BONE GRAFT FROM HEEL  SUBJECTIVE:    PERTINENT HISTORY: Patient is a 53 y.o. female who presents to outpatient physical therapy with a referral for medical diagnosis of Hallux valgus of left foot and congenital metatarsus adductus, left foot. This patient's chief complaints consist of Stiffness across left foot.  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  has a past medical history of GERD (gastroesophageal reflux disease), History of 2019 novel coronavirus disease (COVID-19) (04/10/2020), Motion sickness, and Wears contact lenses.  has a past surgical history that includes Dilation and curettage of uterus; Ectopic pregnancy surgery; Esophagogastroduodenoscopy (egd) with propofol (N/A, 02/21/2018); and polypectomy (02/21/2018).   SUBJECTIVE STATEMENT: Patient arrives to session for progress note at the tenth physical therapy visit to assess progress since the initial evaluation  on 12/05/23.   Feeling good today overall  No change in symptoms since last session  Continued soreness and stiffness in left great toe joint HEP has been going well and has continued to do her exercises at home with no difficulty  Has a follow up appointment with her surgeon on Wednesday 02/19/2024  PAIN: Are you having  pain? No NPRS: 0/10; just stiffness in left big toe joint   PRECAUTIONS: No running or high impact activities  PATIENT GOALS: walking normally, feel less stiff, walking on tiptoes   OBJECTIVE  SELF-REPORTED FUNCTION FOTO score: 65/100 (Foot questionnaire)   (Initial FOTO score from 12/26/23: 65/100)  SELF-REPORTED FUNCTION Patient Specific Functional Scale (PSFS)  Stamina for standing: 8 Walking with no limp: 8 Average: 8    LOWER EXTREMITY ROM (degrees):   Active ROM Right eval Left eval Right 02/03/24 Left  02/03/24  Ankle dorsiflexion (closed chain) 22 15    Ankle plantarflexion 44 37    Ankle inversion 22 22    Ankle eversion 30 20    Great toe extension (PROM) 85 37 90 72  Great toe flexion (PROM) 45 25 49 20    TODAY'S TREATMENT:       Therapeutic exercise: to centralize symptoms and improve ROM, strength, muscular endurance, and activity tolerance required for successful completion of functional activities.   Treadmill 2.9 mph at 0% grade with intermittent UE support. For improved lower extremity mobility, muscular endurance, and weightbearing activity tolerance; and to induce the analgesic effect of aerobic exercise, stimulate improved joint nutrition, and prepare body structures and systems for following interventions. x 5 minutes.   Double Leg Heel Raises     1x10     Single Leg Heel Raises     1x10 (tenderness/soreness at left great toe joint)     Great Toe PROM (see above)   PATIENT EDUCATION:  Education details: Exercise purpose/form. Self management techniques. Education on diagnosis, prognosis, POC, anatomy and physiology of current condition. Education on HEP. Person educated: Patient Education method: Explanation, Demonstration, and Verbal cues Education comprehension: verbalized understanding, returned demonstration, and needs further education  HOME EXERCISE PROGRAM: Access Code: [email or web address] URL: [email or web address] Date:  01/23/2024 Prepared by: Ravyn Nikkel [place]  Exercises - Standing Toe Dorsiflexion Stretch  - 1-2 x daily - 1 sets - 20 reps - 5 seconds hold - Seated Anterior Tibialis Stretch  - 1-2 x daily - 1 sets - 20 reps - 5 seconds hold - Toe Yoga - Alternating Great Toe and Lesser Toe Extension  - 7 x weekly - 2 sets - 10 reps - Toe Spreading  - 1 x daily - 2 sets - 10 reps - 4 seconds hold - Standing Heel Raises  - 3 x weekly - 2 sets - 20 reps - Reverse Lunge  - 3 x weekly - 2 sets - 15 reps - Single Leg Stance  - 3 x weekly - 2 sets - 30 hold - Heel Raise on Step  - 3 x weekly - 2 sets - 20 reps - Lateral Lunge  - 3 x weekly - 2 sets - 20 reps - Forward Fall Out Lunge  - 3 x weekly - 2 sets - 20 reps - Squat  - 3 x weekly - 2 sets - 15 reps   ASSESSMENT:  CLINICAL IMPRESSION: Patient has attended 10 skilled physical therapy treatment sessions this episode of care and has met one long term goal and continues to make progress towards meeting remaining stated  goals. Focus of current treatment sessions is to progressively increase foot/ankle/toe range of motion, strength, proprioception, and endurance as tolerated. Patient demonstrates improvement in pain, walking activity tolerance, foot/ankle strength, great toe ROM, neuromuscular control, and continued understanding of self-management techniques. Patient continues to present with great toe range of motion and ankle strength impairments that are limiting ability to complete prolonged walking, hiking, and raising up onto balls of her feet without difficulty. Patient will benefit from continued skilled physical therapy intervention to address current body structure impairments and activity limitations to improve function and work towards goals set in current POC in order to return to prior level of function or maximal functional improvement.    From initial PT evaluation on 12/05/2023:  Patient is a 53 y.o. female referred to outpatient physical therapy  with a medical diagnosis of Hallux valgus of left foot and congenital metatarsus adductus, left foot who presents with signs and symptoms consistent with decreased ROM across left foot and ankle, antalgic gait, and decreased motor control/coordination of left foot musculature.  Patient is s/p LEFT FOOT BUNION CORRECTION, MET ADDUCTUS CORRECTION WITH JOINT FUSIONS AND BONE CUTS BONE GRAFT FROM HEEL on 08/23/2023.  Patient will benefit from skilled physical therapy intervention to address current body structure impairments and activity limitations to improve function and work towards goals set in current POC in order to return to prior level of function or maximal functional improvement.    OBJECTIVE IMPAIRMENTS: Abnormal gait, decreased activity tolerance, decreased coordination, decreased endurance, decreased knowledge of condition, decreased mobility, difficulty walking, decreased ROM, hypomobility, increased edema, impaired perceived functional ability, impaired flexibility, and obesity.   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  has a past medical history of GERD (gastroesophageal reflux disease), History of 2019 novel coronavirus disease (COVID-19) (04/10/2020), Motion sickness, and Wears contact lenses.  has a past surgical history that includes Dilation and curettage of uterus; Ectopic pregnancy surgery; Esophagogastroduodenoscopy (egd) with propofol (N/A, 02/21/2018); and polypectomy (02/21/2018).   are also affecting patient's functional outcome.   REHAB POTENTIAL: Excellent  CLINICAL DECISION  MAKING: Stable/uncomplicated  EVALUATION COMPLEXITY: Low   GOALS: Goals reviewed with patient? No  SHORT TERM GOALS: Target date: 12/19/2023  Patient will be independent with initial home exercise program for self-management of symptoms. Baseline: Initial HEP provided at IE (12/05/23); participating 30% of the time (12/26/23); continued participation in HEP (02/03/24) Goal status: In progress   LONG TERM GOALS: Target date: 02/27/2024  Patient will be independent with a long-term home exercise program for self-management of symptoms.  Baseline: Initial HEP provided at IE (12/05/23); participating 30% of the time (12/26/23); continued participation in HEP (02/03/24); Goal status: In progress  2.  Patient will demonstrate improved FOTO by equal or greater than 10 points by visit #10 to demonstrate improvement in overall condition and self-reported functional ability.  Baseline: To be assessed (12/05/23); 65 at visit #2 (12/26/23); 65 at visit #10 (02/03/24); Goal status: In progress  3.  Patient will complete 20 single leg heel raises on her left foot to allow her to walk with proper gait mechanics and climb hills/embankments at work. Baseline: 10 double leg heel raises (12/05/23); 10 single leg heel raises on left LE  with shoes on, tenderness/soreness in left great toe joint (02/03/24); Goal status: In progress  4.  Patient will demonstrate 65 degrees of MTP extension to allow patient to walk and climb stairs without risk of future injury. Baseline: 37 degrees (12/05/23); 72 degrees at visit #10 (02/03/24); Goal status: MET  5.  Patient will demonstrate improvement in Patient Specific Functional Scale (PSFS) of equal or greater than 3 points to reflect clinically significant improvement in patient's most valued functional activities. Baseline: To be assessed at visit 2 (12/05/23); 6 at visit #2 (12/26/23); 8 at visit #10 (02/03/24) Goal status: In progress   PLAN:  PT FREQUENCY:  1-2x/week  PT DURATION: 12 weeks  PLANNED INTERVENTIONS: 97110-Therapeutic exercises, 97530- Therapeutic activity, [id number]- Neuromuscular re-education, 97535- Self Care, 16109- Manual therapy, [phone]- Gait training, Balance training, Stair training, Dry Needling, Joint mobilization, Joint manipulation, Scar mobilization, Compression bandaging, Cryotherapy, and Moist heat  PLAN FOR NEXT SESSION: Update HEP as appropriate, progressive foot/ankle/toe ROM/strength/proprioception/functional exercises as tolerated. Great toe flexion/extension ROM. Manual therapy as needed. Education.   Livian Vanderbeck [place], SPT [employer] DPTE   Huntley Dec R. Ilsa Iha, PT, DPT 02/03/24, 5:19 PM  [facility] [address] [place], [place] [address] P: [phone] I F: [phone]

## 2024-02-06 ENCOUNTER — Ambulatory Visit: Payer: BC Managed Care – PPO | Admitting: Physical Therapy

## 2024-02-10 ENCOUNTER — Encounter: Payer: Self-pay | Admitting: Physical Therapy

## 2024-02-10 ENCOUNTER — Ambulatory Visit: Payer: BC Managed Care – PPO | Admitting: Physical Therapy

## 2024-02-10 DIAGNOSIS — M25675 Stiffness of left foot, not elsewhere classified: Secondary | ICD-10-CM | POA: Diagnosis not present

## 2024-02-10 DIAGNOSIS — M25672 Stiffness of left ankle, not elsewhere classified: Secondary | ICD-10-CM

## 2024-02-10 DIAGNOSIS — M6281 Muscle weakness (generalized): Secondary | ICD-10-CM

## 2024-02-10 DIAGNOSIS — R262 Difficulty in walking, not elsewhere classified: Secondary | ICD-10-CM

## 2024-02-10 NOTE — Therapy (Unsigned)
 OUTPATIENT PHYSICAL THERAPY TREATMENT    Patient Name: [name] [name] MRN: [id number] DOB:10-19-1971, 53 y.o., female Today's Date: 02/10/2024  END OF SESSION:  PT End of Session - 02/10/24 1430     Visit Number 11    Number of Visits 17    Date for PT Re-Evaluation 02/27/24    Authorization Type BLUE CROSS BL[employer] - Reporting period from 12/05/2023    Authorization Time Period 30 per cal yr/30 remain    Authorization - Visit Number 11    Authorization - Number of Visits 30    Progress Note Due on Visit 10    PT Start Time 1347    PT Stop Time 1429    PT Time Calculation (min) 42 min    Activity Tolerance Patient tolerated treatment well    Behavior During Therapy [facility] for tasks assessed/performed               Past Medical History:  Diagnosis Date   GERD (gastroesophageal reflux disease)    History of 2019 novel coronavirus disease (COVID-19) 04/10/2020   Motion sickness    roller coasters   Wears contact lenses    Past Surgical History:  Procedure Laterality Date   DILATION AND CURETTAGE OF UTERUS     ECTOPIC PREGNANCY SURGERY     ESOPHAGOGASTRODUODENOSCOPY (EGD) WITH PROPOFOL N/A 02/21/2018   Procedure: ESOPHAGOGASTRODUODENOSCOPY (EGD) WITH PROPOFOL;  Surgeon: Midge Minium, MD;  Location: [facility];  Service: Endoscopy;  Laterality: N/A;   POLYPECTOMY  02/21/2018   Procedure: POLYPECTOMY;  Surgeon: Midge Minium, MD;  Location: [facility];  Service: Endoscopy;;   Patient Active Problem List   Diagnosis Date Noted   Heartburn    Gastric polyp    Gastroesophageal reflux disease 09/27/2016   Hiatal hernia 09/27/2016   History of hay fever 10/19/2015   Foot pain 10/19/2015   Breast lump 10/19/2015   H/O gastrointestinal disease 10/19/2015   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 10/19/2015    PCP: Alm Bustard, NP  REFERRING PROVIDER: Edwin Cap, DPM  REFERRING DIAG: Hallux valgus of left foot; Congenital  metatarsus adductus, left foot   THERAPY DIAG:  Stiffness of left foot, not elsewhere classified  Stiffness of left ankle, not elsewhere classified  Muscle weakness (generalized)  Difficulty in walking, not elsewhere classified  Rationale for Evaluation and Treatment: Rehabilitation  ONSET DATE: Surgery on 08/23/2023 LEFT FOOT BUNION CORRECTION, MET ADDUCTUS CORRECTION WITH JOINT FUSIONS AND BONE CUTS BONE GRAFT FROM HEEL  SUBJECTIVE:    PERTINENT HISTORY: Patient is a 53 y.o. female who presents to outpatient physical therapy with a referral for medical diagnosis of Hallux valgus of left foot and congenital metatarsus adductus, left foot. This patient's chief complaints consist of Stiffness across left foot.  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  has a past medical history of GERD (gastroesophageal reflux disease), History of 2019 novel coronavirus disease (COVID-19) (04/10/2020), Motion sickness, and Wears contact lenses.  has a past surgical history that includes Dilation and curettage of uterus; Ectopic pregnancy surgery; Esophagogastroduodenoscopy (egd) with propofol (N/A, 02/21/2018); and polypectomy (02/21/2018).   SUBJECTIVE STATEMENT: She is feeling good today  Left great toe feeling about the same as last time  Continued soreness/stiffness in left great toe joint  Exercises have been going  well at home  Has a follow up appointment with her surgeon on Wednesday 02/19/2024  PAIN: Are you having pain? No NPRS: 0/10; just stiffness in left big toe joint   PRECAUTIONS: No running or high impact activities  PATIENT GOALS: walking normally, feel less stiff, walking on tiptoes   OBJECTIVE  SELF-REPORTED FUNCTION (last measured  02/03/24) Patient Specific Functional Scale (PSFS)  Stamina for standing: 8 Walking with no limp: 8 Average: 8    TODAY'S TREATMENT:       Therapeutic exercise: to centralize symptoms and improve ROM, strength, muscular endurance, and activity tolerance required for successful completion of functional activities.   Treadmill 2.9 mph at 0% grade with intermittent UE support. For improved lower extremity mobility, muscular endurance, and weightbearing activity tolerance; and to induce the analgesic effect of aerobic exercise, stimulate improved joint nutrition, and prepare body structures and systems for following interventions. x 5 minutes.   Standing double leg heel raise with forefoot on airex beam (UE support available as needed)   2x20   Therapeutic activities: dynamic therapeutic activities incorporating MULTIPLE parameters or areas of the body designed to achieve improved functional performance.   Runners step up with UE support as needed to improve ability to complete hiking and work towards push of/landing needed for running or jumping                                   17 inch grey chair, 1x20 each side    Squats on BOSU (ball side down)             2x20  Kickstand Deadlift with rear foot on DynaDisc   2x15 each side with 10# KB   Neuromuscular Re-education: to improve, balance, postural strength, muscle activation patterns, and stabilization strength required for functional activities:   Single Leg Stance on Airex pad with ball toss on rebounder             2x20 tosses each leg  Forward lunges onto BOSU (ball side up)            2x20 each leg    PATIENT EDUCATION:  Education details: Exercise purpose/form. Self management techniques. Education on diagnosis, prognosis, POC, anatomy and physiology of current condition. Education on HEP. Person educated: Patient Education method: Explanation, Demonstration, and Verbal cues Education comprehension: verbalized understanding,  returned demonstration, and needs further education  HOME EXERCISE PROGRAM: Access Code: [email or web address] URL: [email or web address] Date: 01/23/2024 Prepared by: Mckaylin Bastien [place]  Exercises - Standing Toe Dorsiflexion Stretch  - 1-2 x daily - 1 sets - 20 reps - 5 seconds hold - Seated Anterior Tibialis Stretch  - 1-2 x daily - 1 sets - 20 reps - 5 seconds hold - Toe Yoga - Alternating Great Toe and Lesser Toe Extension  - 7 x weekly - 2 sets - 10 reps - Toe Spreading  - 1 x daily - 2 sets - 10 reps - 4 seconds hold - Standing Heel Raises  - 3 x weekly - 2 sets - 20 reps - Reverse Lunge  - 3 x weekly - 2 sets - 15 reps - Single Leg Stance  - 3 x weekly - 2 sets - 30 hold - Heel Raise on Step  - 3 x weekly - 2 sets - 20 reps - Lateral Lunge  - 3 x weekly - 2 sets - 20 reps - Forward Fall Out Lunge  -  3 x weekly - 2 sets - 20 reps - Squat  - 3 x weekly - 2 sets - 15 reps - Kickstand Deadlift - 3-4x weekly - 2 sets - 15 reps    ASSESSMENT:  CLINICAL IMPRESSION: Patient arrives to session reporting continued soreness/stiffness in left great toe joint. She has continued good participation in her HEP. The focus of today's session was to continue progressing exercises to improve lower extremity/ankle/foot strength and endurance, great toe/foot range of motion, and balance. Patient required periodic rest breaks due to feeling overheated but tolerated interventions well and was able to complete all exercises with no change in pain. Patient provided updated HEP to include kickstand dead lifts. Patient reports feeling warm/like she had a workout at the end of session with more left great toe joint stiffness at the end of session. Patient would benefit from continued management of limiting condition by skilled physical therapist to address remaining impairments and functional limitations to work towards stated goals and return to PLOF or maximal functional independence.    From initial PT  evaluation on 12/05/2023:  Patient is a 53 y.o. female referred to outpatient physical therapy with a medical diagnosis of Hallux valgus of left foot and congenital metatarsus adductus, left foot who presents with signs and symptoms consistent with decreased ROM across left foot and ankle, antalgic gait, and decreased motor control/coordination of left foot musculature.  Patient is s/p LEFT FOOT BUNION CORRECTION, MET ADDUCTUS CORRECTION WITH JOINT FUSIONS AND BONE CUTS BONE GRAFT FROM HEEL on 08/23/2023.  Patient will benefit from skilled physical therapy intervention to address current body structure impairments and activity limitations to improve function and work towards goals set in current POC in order to return to prior level of function or maximal functional improvement.    OBJECTIVE IMPAIRMENTS: Abnormal gait, decreased activity tolerance, decreased coordination, decreased endurance, decreased knowledge of condition, decreased mobility, difficulty walking, decreased ROM, hypomobility, increased edema, impaired perceived functional ability, impaired flexibility, and obesity.   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  has a past medical history of GERD (gastroesophageal reflux disease), History of 2019 novel coronavirus disease (COVID-19) (04/10/2020), Motion sickness, and Wears contact lenses.  has a past surgical history that includes Dilation and curettage of uterus; Ectopic pregnancy surgery; Esophagogastroduodenoscopy (egd) with propofol (N/A, 02/21/2018); and polypectomy (02/21/2018).   are  also affecting patient's functional outcome.   REHAB POTENTIAL: Excellent  CLINICAL DECISION MAKING: Stable/uncomplicated  EVALUATION COMPLEXITY: Low   GOALS: Goals reviewed with patient? No  SHORT TERM GOALS: Target date: 12/19/2023  Patient will be independent with initial home exercise program for self-management of symptoms. Baseline: Initial HEP provided at IE (12/05/23); participating 30% of the time (12/26/23); continued participation in HEP (02/03/24) Goal status: In progress   LONG TERM GOALS: Target date: 02/27/2024  Patient will be independent with a long-term home exercise program for self-management of symptoms.  Baseline: Initial HEP provided at IE (12/05/23); participating 30% of the time (12/26/23); continued participation in HEP (02/03/24); Goal status: In progress  2.  Patient will demonstrate improved FOTO by equal or greater than 10 points by visit #10 to demonstrate improvement in overall condition and self-reported functional ability.  Baseline: To  be assessed (12/05/23); 65 at visit #2 (12/26/23); 65 at visit #10 (02/03/24); Goal status: In progress  3.  Patient will complete 20 single leg heel raises on her left foot to allow her to walk with proper gait mechanics and climb hills/embankments at work. Baseline: 10 double leg heel raises (12/05/23); 10 single leg heel raises on left LE with shoes on, tenderness/soreness in left great toe joint (02/03/24); Goal status: In progress  4.  Patient will demonstrate 65 degrees of MTP extension to allow patient to walk and climb stairs without risk of future injury. Baseline: 37 degrees (12/05/23); 72 degrees at visit #10 (02/03/24); Goal status: MET  5.  Patient will demonstrate improvement in Patient Specific Functional Scale (PSFS) of equal or greater than 3 points to reflect clinically significant improvement in patient's most valued functional activities. Baseline: To be assessed at visit 2 (12/05/23); 6 at visit #2  (12/26/23); 8 at visit #10 (02/03/24) Goal status: In progress   PLAN:  PT FREQUENCY: 1-2x/week  PT DURATION: 12 weeks  PLANNED INTERVENTIONS: 97110-Therapeutic exercises, 97530- Therapeutic activity, [id number]- Neuromuscular re-education, 97535- Self Care, [id number]- Manual therapy, [id number]- Gait training, Balance training, Stair training, Dry Needling, Joint mobilization, Joint manipulation, Scar mobilization, Compression bandaging, Cryotherapy, and Moist heat  PLAN FOR NEXT SESSION: Update HEP as appropriate, progressive foot/ankle/toe ROM/strength/proprioception/functional exercises as tolerated. Great toe flexion/extension ROM. Manual therapy as needed. Education.   Anum Palecek [place], SPT [employer] DPTE   Huntley Dec R. Ilsa Iha, PT, DPT 02/10/24, 2:31 PM  [facility] [address] [place], [place] [address] P: [phone] I F: [phone]

## 2024-02-13 ENCOUNTER — Ambulatory Visit: Payer: BC Managed Care – PPO | Admitting: Physical Therapy

## 2024-02-18 ENCOUNTER — Encounter: Payer: Self-pay | Admitting: Obstetrics

## 2024-02-18 ENCOUNTER — Ambulatory Visit (INDEPENDENT_AMBULATORY_CARE_PROVIDER_SITE_OTHER): Payer: BC Managed Care – PPO | Admitting: Obstetrics

## 2024-02-18 VITALS — BP 139/68 | HR 68 | Ht 61.42 in | Wt 168.6 lb

## 2024-02-18 DIAGNOSIS — N952 Postmenopausal atrophic vaginitis: Secondary | ICD-10-CM | POA: Diagnosis not present

## 2024-02-18 DIAGNOSIS — R351 Nocturia: Secondary | ICD-10-CM

## 2024-02-18 DIAGNOSIS — N393 Stress incontinence (female) (male): Secondary | ICD-10-CM | POA: Diagnosis not present

## 2024-02-18 DIAGNOSIS — M25675 Stiffness of left foot, not elsewhere classified: Secondary | ICD-10-CM | POA: Diagnosis not present

## 2024-02-18 DIAGNOSIS — R8290 Unspecified abnormal findings in urine: Secondary | ICD-10-CM

## 2024-02-18 LAB — POCT URINALYSIS DIPSTICK
Bilirubin, UA: NEGATIVE
Glucose, UA: NEGATIVE
Ketones, UA: NEGATIVE
Leukocytes, UA: NEGATIVE
Nitrite, UA: NEGATIVE
Protein, UA: NEGATIVE
Spec Grav, UA: 1.01 (ref 1.010–1.025)
Urobilinogen, UA: 0.2 U/dL
pH, UA: 6.5 (ref 5.0–8.0)

## 2024-02-18 LAB — URINALYSIS, ROUTINE W REFLEX MICROSCOPIC
Bilirubin Urine: NEGATIVE
Glucose, UA: NEGATIVE mg/dL
Hgb urine dipstick: NEGATIVE
Ketones, ur: NEGATIVE mg/dL
Leukocytes,Ua: NEGATIVE
Nitrite: NEGATIVE
Protein, ur: NEGATIVE mg/dL
Specific Gravity, Urine: 1.009 (ref 1.005–1.030)
pH: 7 (ref 5.0–8.0)

## 2024-02-18 NOTE — Assessment & Plan Note (Signed)
-   avoid fluid intake 3 hours before bedtime - elevated feet during the day or use compression socks to reduce lower extremity swelling - reports snoring, consider workup for sleep apnea - encouraged fluid management

## 2024-02-18 NOTE — Assessment & Plan Note (Signed)
-   For symptomatic vaginal atrophy options include lubrication with a water-based lubricant, personal hygiene measures and barrier protection against wetness, and estrogen replacement in the form of vaginal cream, vaginal tablets, or a time-released vaginal ring.   - Rx vaginal estrogen

## 2024-02-18 NOTE — Assessment & Plan Note (Addendum)
-   POCT UA + heme, pending UA micro and culture. PVR 8mL - For treatment of stress urinary incontinence,  non-surgical options include expectant management, weight loss, physical therapy, as well as a pessary.  Surgical options include a midurethral sling, Burch urethropexy, and transurethral injection of a bulking agent. - Rx vaginal estrogen to assess clinical change - discussed office procedure with urethral bulking (Bulkamid). We discussed success rate of approximately 70-80% and possible need for second injection. We reviewed that this is not a permanent procedure and the Bulkamid does dissolve over time. Risks reviewed including injury to bladder or urethra, UTI, urinary retention and hematuria.  - Sling: The effectiveness of a midurethral vaginal mesh sling is approximately 85%, and thus, there will be times when you may leak urine after surgery, especially if your bladder is full or if you have a strong cough. There is a balance between making the sling tight enough to treat your leakage but not too tight so that you have long-term difficulty emptying your bladder. A mesh sling will not directly treat overactive bladder/urge incontinence and may worsen it.  There is an FDA safety notification on vaginal mesh procedures for prolapse but NOT mesh slings. We have extensive experience and training with mesh placement and we have close postoperative follow up to identify any potential complications from mesh. It is important to realize that this mesh is a permanent implant that cannot be easily removed. There are rare risks of mesh exposure (2-4%), pain with intercourse (0-7%), and infection (<1%). The risk of mesh exposure if more likely in a woman with risks for poor healing (prior radiation, poorly controlled diabetes, or immunocompromised). The risk of new or worsened chronic pain after mesh implant is more common in women with baseline chronic pain and/or poorly controlled anxiety or depression.  Approximately 2-4% of patients will experience longer-term post-operative voiding dysfunction that may require surgical revision of the sling. We also reviewed that postoperatively, her stream may not be as strong as before surgery.  - declines pelvic floor PT or pessary - encouraged fluid management, Kegel exercises - sister had midurethral sling and mother had urethral bulking. Patient consider midurethral sling - negative CST with empty bladder, pt to return with full bladder for repeat. Discussed simple CMG needed if no leak noted.

## 2024-02-18 NOTE — Patient Instructions (Addendum)
 For treatment of stress urinary incontinence, which is leakage with physical activity/movement/strainging/coughing, we discussed expectant management versus nonsurgical options versus surgery. Nonsurgical options include weight loss, physical therapy, as well as a pessary.  Surgical options include a midurethral sling, which is a synthetic mesh sling that acts like a hammock under the urethra to prevent leakage of urine, a Burch urethropexy, and transurethral injection of a bulking agent.   For night time frequency: - avoid fluid intake 3 hours before bedtime - elevated your feet during the day or use compression socks to reduce lower extremity swelling - due to snoring, consider workup for sleep apnea  For vaginal atrophy (thinning of the vaginal tissue that can cause dryness and burning) and UTI prevention we discussed estrogen replacement in the form of vaginal cream.   Start vaginal estrogen therapy nightly for two weeks then 2 times weekly at night. This can be placed with your finger or an applicator inside the vagina and around the urethra.  Please let us know if the prescription is too expensive and we can look for alternative options.   Is vaginal estrogen therapy safe for me? Vaginal estrogen preparations act on the vaginal skin, and only a very tiny amount is absorbed into the bloodstream (0.01%).  They work in a similar way to hand or face cream.  There is minimal absorption and they are therefore perfectly safe. If you have had breast cancer and have persistent troublesome symptoms which aren't settling with vaginal moisturisers and lubricants, local estrogen treatment may be a possibility, but consultation with your oncologist should take place first.

## 2024-02-18 NOTE — Assessment & Plan Note (Addendum)
-   POCT UA + heme, catheterized urine pending UA micro and culture - For management of microscopic hematuria, we discussed the importance of work-up including assessing the upper and lower GU tract with CT urogram and cystoscopy if testing is positive x 2.  - Creatinine 0.9 on 03/18/23.  - likely due to vaginal atrophy

## 2024-02-18 NOTE — Progress Notes (Signed)
 New Patient Evaluation and Consultation  Referring Provider: Alm Bustard, NP PCP: Alm Bustard, NP Date of Service: 02/18/2024  SUBJECTIVE Chief Complaint: New Patient (Initial Visit) [name] [name] is a 53 y.o. female here today for stress incontinence.)  History of Present Illness: [name] [name] is a 53 y.o. White or Caucasian female seen in consultation at the request of self for evaluation of urinary incontinence.    Urinary leakage with jumping on trampoline since pregnancy 28 years ago, worsened in the past 20 years ago when was playing soft ball.  Denies new medications, surgery, falls Tried fluid restriction prior to games and managed by 1 pad/game Undergoing PT for hallux valgus with left ankle stiffness and muscle weakness, s/p left foot bunion correction, met adductus correction with joint fusion 08/23/23  Urinary Symptoms: Leaks urine with cough/ sneeze, with movement to the bathroom, and sudden movements Leaks 1 time(s) per weeks when she is unprepared or surprised, or when bladder is full Pad use: 3 liners/ mini-pads per month.   Patient is bothered by UI symptoms.  Day time voids 7-8.  Nocturia: 2-3 times per night to void started 3-5 years ago when she started going through menopause Drinks water until bedtime Reports snoring attributed to sleeping on her bad due to GERD, denies OSA Denies LE edema Voiding dysfunction:  empties bladder well.  Patient does not use a catheter to empty bladder.  When urinating, patient feels no difficulty with voiding. Drinks: 48oz water per day, 22oz of diet mountain dew Frequency worsens with increased fluid intake  UTIs:  0  UTI's in the last year.   Denies history of blood in urine, kidney or bladder stones, pyelonephritis, bladder cancer, and kidney cancer No results found for the last 90 days.   Pelvic Organ Prolapse Symptoms:                  Patient Denies a feeling of a bulge the vaginal area.   Bowel  Symptom: Bowel movements: 1 time(s) per day Stool consistency: soft  Straining: no.  Splinting: no.  Incomplete evacuation: no.  Patient Denies accidental bowel leakage / fecal incontinence Bowel regimen: fiber Last colonoscopy: Results not available for review [facility] Colonoscopy          Upcoming     Colonoscopy (Every 10 Years) Next due on 08/15/2031    08/14/2021  Outside Claim: [place] COLONOSCOPY FLX DX W/COLLJ SPEC WHEN PFRMD   Only the first 1 history entries have been loaded, but more history exists.                Sexual Function Sexually active: yes.  Sexual orientation: Straight Pain with sex: No, dryness during intercourse, intermittent lubrication use with relief  Pelvic Pain Denies pelvic pain   Past Medical History:  Past Medical History:  Diagnosis Date   GERD (gastroesophageal reflux disease)    History of 2019 novel coronavirus disease (COVID-19) 04/10/2020   Motion sickness    roller coasters   Wears contact lenses      Past Surgical History:   Past Surgical History:  Procedure Laterality Date   DILATION AND CURETTAGE OF UTERUS     ECTOPIC PREGNANCY SURGERY     ESOPHAGOGASTRODUODENOSCOPY (EGD) WITH PROPOFOL N/A 02/21/2018   Procedure: ESOPHAGOGASTRODUODENOSCOPY (EGD) WITH PROPOFOL;  Surgeon: Midge Minium, MD;  Location: [facility];  Service: Endoscopy;  Laterality: N/A;   FOOT SURGERY Left    POLYPECTOMY  02/21/2018   Procedure: POLYPECTOMY;  Surgeon: Midge Minium, MD;  Location: [facility];  Service: Endoscopy;;     Past OB/GYN History: OB History  Gravida Para Term Preterm AB Living  3 1 1  2 1   SAB IAB Ectopic Multiple Live Births  1  1  1     # Outcome Date GA Lbr Len/2nd Weight Sex Type Anes PTL Lv  3 Ectopic           2 SAB           1 Term     M Vag-Spont   LIV    Vaginal deliveries: 1, 8lb13oz Forceps/ Vacuum deliveries: unclear, Cesarean section: 0 Menopausal: Yes, at age 68, Denies vaginal bleeding since  menopause Contraception: IUD. Last pap smear was 03/17/20 NILM, neg HPV.  Any history of abnormal pap smears: no. No results found for: "DIAGPAP", "HPVHIGH", "ADEQPAP"  Medications: Patient has a current medication list which includes the following prescription(s): dexilant, diclofenac sodium, homeopathic products, levonorgestrel, multiple vitamin, probiotic product, senna-docusate, and [DISCONTINUED] pantoprazole.   Allergies: Patient is allergic to sulfa antibiotics.   Social History:  Social History   Tobacco Use   Smoking status: Never   Smokeless tobacco: Never  Vaping Use   Vaping status: Never Used  Substance Use Topics   Alcohol use: No   Drug use: No    Relationship status: married Patient lives with her husband.   Patient is employed as a Biochemist, clinical. Regular exercise: Yes: walking Limited due to to foot surgery History of abuse: No  Family History:   Family History  Problem Relation Age of Onset   Hypertension Mother    Atrial fibrillation Mother    Healthy Father    Ovarian cancer Maternal Aunt    Breast cancer Neg Hx    Uterine cancer Neg Hx    Bladder Cancer Neg Hx      Review of Systems: Review of Systems  Constitutional:  Negative for fever, malaise/fatigue and weight loss.  Respiratory:  Negative for cough, shortness of breath and wheezing.   Cardiovascular:  Negative for chest pain, palpitations and leg swelling.  Gastrointestinal:  Negative for abdominal pain, blood in stool, constipation and diarrhea.  Genitourinary:  Positive for frequency (night time) and urgency. Negative for hematuria.       Leakage  Skin:  Negative for rash.  Neurological:  Negative for dizziness, weakness and headaches.  Endo/Heme/Allergies:  Does not bruise/bleed easily.       Hot flashes  Psychiatric/Behavioral:  Negative for depression. The patient is not nervous/anxious.      OBJECTIVE Physical Exam: Vitals:   02/18/24 1432  BP: 139/68  Pulse: 68   Weight: 168 lb 9.6 oz (76.5 kg)  Height: 5' 1.42" (1.56 m)    Physical Exam Constitutional:      General: She is not in acute distress.    Appearance: Normal appearance.  Genitourinary:     Bladder and urethral meatus normal.     No lesions in the vagina.     Right Labia: No rash, tenderness, lesions, skin changes or Bartholin's cyst.    Left Labia: No tenderness, lesions, skin changes, Bartholin's cyst or rash.    No vaginal discharge, erythema, tenderness, bleeding, ulceration or granulation tissue.     Mild vaginal atrophy present.     Right Adnexa: not tender, not full and no mass present.    Left Adnexa: not tender, not full and no mass present.    No cervical motion tenderness, discharge,  friability, lesion, polyp or nabothian cyst.     IUD strings visualized.     Uterus is not enlarged, fixed, tender, irregular or prolapsed.     No uterine mass detected.    Urethral meatus caruncle not present.    No urethral prolapse, tenderness, mass, hypermobility, discharge or stress urinary incontinence with cough stress test present.     Bladder is not tender, urgency on palpation not present and masses not present.      Pelvic Floor: Levator muscle strength is 2/5.    Levator ani not tender, obturator internus not tender, no asymmetrical contractions present and no pelvic spasms present.    Symmetrical pelvic sensation, anal wink present and BC reflex present. Cardiovascular:     Rate and Rhythm: Normal rate.  Pulmonary:     Effort: Pulmonary effort is normal. No respiratory distress.  Abdominal:     General: There is no distension.     Palpations: Abdomen is soft. There is no mass.     Tenderness: There is no abdominal tenderness.     Hernia: No hernia is present.    Neurological:     Mental Status: She is alert.  Vitals reviewed. Exam conducted with a chaperone present.      POP-Q:   POP-Q  -2                                            Aa   -2                                            Ba  -7                                              C   2                                            Gh  2                                            Pb  8                                            tvl   -2                                            Ap  -2                                            Bp  -7  D     Post-Void Residual (PVR) by Bladder Scan: In order to evaluate bladder emptying, we discussed obtaining a postvoid residual and patient agreed to this procedure.  Procedure: The ultrasound unit was placed on the patient's abdomen in the suprapubic region after the patient had voided.    Post Void Residual - 02/18/24 1434       Post Void Residual   Post Void Residual 8 mL            Straight Catheterization Procedure for PVR: After verbal consent was obtained from the patient for catheterization to assess bladder emptying and residual volume the urethra and surrounding tissues were prepped with betadine and an in and out catheterization was performed.  PVR was 70mL.  Urine appeared clear yellow. The patient tolerated the procedure well.   Laboratory Results: Lab Results  Component Value Date   COLORU Yellow 02/18/2024   CLARITYU Clear 02/18/2024   GLUCOSEUR Negative 02/18/2024   BILIRUBINUR Negative 02/18/2024   KETONESU Negative 02/18/2024   SPECGRAV 1.010 02/18/2024   RBCUR Trace 02/18/2024   PHUR 6.5 02/18/2024   PROTEINUR Negative 02/18/2024   UROBILINOGEN 0.2 02/18/2024   LEUKOCYTESUR Negative 02/18/2024    No results found for: "CREATININE"  No results found for: "HGBA1C"  No results found for: "HGB"   ASSESSMENT AND PLAN Ms. Drummonds is a 53 y.o. with:  1. Nocturia   2. SUI (stress urinary incontinence, female)   3. Vaginal atrophy   4. Abnormal urinalysis     Nocturia Assessment & Plan: - avoid fluid intake 3 hours before bedtime - elevated feet during the day or use  compression socks to reduce lower extremity swelling - reports snoring, consider workup for sleep apnea - encouraged fluid management  Orders: -     POCT urinalysis dipstick  SUI (stress urinary incontinence, female) Assessment & Plan: - POCT UA + heme, pending UA micro and culture. PVR 8mL - For treatment of stress urinary incontinence,  non-surgical options include expectant management, weight loss, physical therapy, as well as a pessary.  Surgical options include a midurethral sling, Burch urethropexy, and transurethral injection of a bulking agent. - Rx vaginal estrogen to assess clinical change - discussed office procedure with urethral bulking (Bulkamid). We discussed success rate of approximately 70-80% and possible need for second injection. We reviewed that this is not a permanent procedure and the Bulkamid does dissolve over time. Risks reviewed including injury to bladder or urethra, UTI, urinary retention and hematuria.  - Sling: The effectiveness of a midurethral vaginal mesh sling is approximately 85%, and thus, there will be times when you may leak urine after surgery, especially if your bladder is full or if you have a strong cough. There is a balance between making the sling tight enough to treat your leakage but not too tight so that you have long-term difficulty emptying your bladder. A mesh sling will not directly treat overactive bladder/urge incontinence and may worsen it.  There is an FDA safety notification on vaginal mesh procedures for prolapse but NOT mesh slings. We have extensive experience and training with mesh placement and we have close postoperative follow up to identify any potential complications from mesh. It is important to realize that this mesh is a permanent implant that cannot be easily removed. There are rare risks of mesh exposure (2-4%), pain with intercourse (0-7%), and infection (<1%). The risk of mesh exposure if more likely in a woman with risks for poor  healing (prior radiation,  poorly controlled diabetes, or immunocompromised). The risk of new or worsened chronic pain after mesh implant is more common in women with baseline chronic pain and/or poorly controlled anxiety or depression. Approximately 2-4% of patients will experience longer-term post-operative voiding dysfunction that may require surgical revision of the sling. We also reviewed that postoperatively, her stream may not be as strong as before surgery.  - declines pelvic floor PT or pessary - encouraged fluid management, Kegel exercises - sister had midurethral sling and mother had urethral bulking. Patient consider midurethral sling - negative CST with empty bladder, pt to return with full bladder for repeat. Discussed simple CMG needed if no leak noted.    Vaginal atrophy Assessment & Plan: - For symptomatic vaginal atrophy options include lubrication with a water-based lubricant, personal hygiene measures and barrier protection against wetness, and estrogen replacement in the form of vaginal cream, vaginal tablets, or a time-released vaginal ring.   - Rx vaginal estrogen   Abnormal urinalysis Assessment & Plan: - POCT UA + heme, catheterized urine pending UA micro and culture - For management of microscopic hematuria, we discussed the importance of work-up including assessing the upper and lower GU tract with CT urogram and cystoscopy if testing is positive x 2.  - Creatinine 0.9 on 03/18/23.  - likely due to vaginal atrophy  Orders: -     Urine Microscopic; Future  Time spent: I spent 77 minutes dedicated to the care of this patient on the date of this encounter to include pre-visit review of records, face-to-face time with the patient discussing stress urinary incontinence, vaginal atrophy, abnormal urinalysis, nocturia, and post visit documentation and ordering medication/ testing.   Loleta Chance, MD

## 2024-02-19 ENCOUNTER — Ambulatory Visit (INDEPENDENT_AMBULATORY_CARE_PROVIDER_SITE_OTHER): Payer: BC Managed Care – PPO

## 2024-02-19 ENCOUNTER — Ambulatory Visit (INDEPENDENT_AMBULATORY_CARE_PROVIDER_SITE_OTHER): Payer: BC Managed Care – PPO | Admitting: Podiatry

## 2024-02-19 ENCOUNTER — Encounter: Payer: Self-pay | Admitting: Podiatry

## 2024-02-19 DIAGNOSIS — Z9889 Other specified postprocedural states: Secondary | ICD-10-CM

## 2024-02-19 DIAGNOSIS — M2012 Hallux valgus (acquired), left foot: Secondary | ICD-10-CM

## 2024-02-19 DIAGNOSIS — Q66222 Congenital metatarsus adductus, left foot: Secondary | ICD-10-CM | POA: Diagnosis not present

## 2024-02-19 NOTE — Progress Notes (Signed)
  Subjective:  Patient ID: [name] [name], female    DOB: August 23, 1971,  MRN: [id number]  Chief Complaint  Patient presents with   Routine Post Op    POV #4    DOS 08/23/23 --- LEFT FOOT BUNION CORRECTION, MET ADDUCTUS CORRECTION WITH JOINT FUSIONS AND BONE CUTS BONE GRAFT FROM HEEL "It's doing pretty good.  There's still some numbness and stiffness but I'm still doing PT."    DOS: 08/23/2023 Procedure: Left foot multiple midfoot fusions with correction of bunionectomy and osteotomy of metatarsal  53 y.o. female returns for post-op check.   Review of Systems: Negative except as noted in the HPI. Denies N/V/F/Ch.   Objective:   There were no vitals filed for this visit.  There is no height or weight on file to calculate BMI. Constitutional Well developed. Well nourished.  Vascular Foot warm and well perfused. Capillary refill normal to all digits.  Calf is soft and supple, no posterior calf or knee pain, negative Homans' sign  Neurologic Normal speech. Oriented to person, place, and time. Epicritic sensation to light touch grossly present bilaterally.  Dermatologic Incisions are well-healed  Orthopedic: There is no edema.  Some limited MTP range of motion of the first toe, improved since last visit   Multiple view plain film radiographs: Good correction is maintained she has complete consolidation across all osteotomy and fusion sites, no change in alignment or hardware Assessment:   1. Status post foot surgery   2. Hallux valgus of left foot   3. Congenital metatarsus adductus, left foot    Plan:  Patient was evaluated and treated and all questions answered.  S/p foot surgery left -Overall doing quite well.  She will continue physical therapy as long as she is getting benefit from it.  She may continue regular shoe gear and activity as tolerated.  Follow-up with me as needed if she has further issues with this or the other foot. Return if symptoms worsen or fail to improve.

## 2024-02-24 ENCOUNTER — Encounter: Payer: Self-pay | Admitting: Obstetrics

## 2024-02-24 MED ORDER — ESTRADIOL 0.1 MG/GM VA CREA: TOPICAL_CREAM | VAGINAL | 3 refills | Status: DC

## 2024-02-26 ENCOUNTER — Ambulatory Visit: Payer: BC Managed Care – PPO | Admitting: Physical Therapy

## 2024-03-02 ENCOUNTER — Ambulatory Visit: Payer: BC Managed Care – PPO | Admitting: Physical Therapy

## 2024-03-04 ENCOUNTER — Encounter: Payer: BC Managed Care – PPO | Admitting: Physical Therapy

## 2024-03-09 ENCOUNTER — Encounter: Payer: BC Managed Care – PPO | Admitting: Physical Therapy

## 2024-03-09 NOTE — Progress Notes (Unsigned)
  Urogynecology Pre-Operative H&P  Subjective Chief Complaint: [name] [name] presents for a preoperative encounter.   History of Present Illness: [name] [name] is a 53 y.o. female who presents for preoperative visit.  She is scheduled to undergo *** on ***.  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   Started vaginal estrogen  ***CMG *** repeat POP-Q  Urodynamics showed: ***  Past Medical History:  Diagnosis Date   GERD (gastroesophageal reflux disease)    History of 2019 novel coronavirus disease (COVID-19) 04/10/2020   Motion sickness    roller coasters   Wears contact lenses      Past Surgical History:  Procedure Laterality Date   DILATION AND CURETTAGE OF UTERUS     ECTOPIC PREGNANCY SURGERY     ESOPHAGOGASTRODUODENOSCOPY (EGD) WITH PROPOFOL N/A 02/21/2018   Procedure: ESOPHAGOGASTRODUODENOSCOPY (EGD) WITH PROPOFOL;  Surgeon: Midge Minium, MD;  Location: [facility];  Service: Endoscopy;  Laterality: N/A;   FOOT SURGERY Left    POLYPECTOMY  02/21/2018   Procedure: POLYPECTOMY;  Surgeon: Midge Minium, MD;  Location: [facility];  Service: Endoscopy;;    is allergic to sulfa antibiotics.   Family History  Problem Relation Age of Onset   Hypertension Mother    Atrial fibrillation Mother    Healthy Father    Ovarian cancer Maternal Aunt    Breast cancer Neg Hx    Uterine cancer Neg Hx    Bladder Cancer Neg Hx     Social History   Tobacco Use   Smoking status: Never   Smokeless tobacco: Never  Vaping Use   Vaping status: Never Used  Substance Use Topics   Alcohol use: No   Drug use: No     Review of Systems was negative for a full 10 system review except as noted in the History of Present Illness.   Current Outpatient Medications:    dexlansoprazole (DEXILANT) 60  MG capsule, Take 1 capsule (60 mg total) by mouth daily. **PLEASE SCHEDULE FOLLOW UP APPT**, Disp: 90 capsule, Rfl: 0   diclofenac Sodium (VOLTAREN) 1 % GEL, Apply 4 g topically 4 (four) times daily., Disp: 100 g, Rfl: 4   estradiol (ESTRACE) 0.1 MG/GM vaginal cream, Place 0.5-1g nightly for two weeks then twice a week after, Disp: 30 g, Rfl: 3   Homeopathic Products (ALLERGY MEDICINE PO), Take by mouth., Disp: , Rfl:    levonorgestrel (MIRENA) 20 MCG/24HR IUD, by Intrauterine route., Disp: , Rfl:    MULTIPLE VITAMIN PO, Take by mouth., Disp: , Rfl:    Probiotic Product (PROBIOTIC DAILY PO), Take by mouth., Disp: , Rfl:    senna-docusate (SENOKOT-S) 8.6-50 MG tablet, Take 1 tablet by mouth daily., Disp: , Rfl:    Objective There were no vitals filed for this visit.  Gen: NAD CV: S1 S2 RRR Lungs: Clear to auscultation bilaterally Abd: soft, nontender   Previous Pelvic Exam showed: ***    Assessment/ Plan  Assessment: The patient is a 53 y.o. year old scheduled to undergo ***. {desc; verbal/written:16408} consent was obtained for these procedures.  Plan: General Surgical Consent: The patient has previously been counseled on alternative treatments, and the decision by the patient and provider was to proceed with the procedure listed above.  For all procedures, there are risks of bleeding, infection, damage to surrounding organs including but not  limited to bowel, bladder, blood vessels, ureters and nerves, and need for further surgery if an injury were to occur. These risks are all low with minimally invasive surgery.   There are risks of numbness and weakness at any body site or buttock/rectal pain.  It is possible that baseline pain can be worsened by surgery, either with or without mesh. If surgery is vaginal, there is also a low risk of possible conversion to laparoscopy or open abdominal incision where indicated. Very rare risks include blood transfusion, blood clot, heart attack,  pneumonia, or death.   There is also a risk of short-term postoperative urinary retention with need to use a catheter. About half of patients need to go home from surgery with a catheter, which is then later removed in the office. The risk of long-term need for a catheter is very low. There is also a risk of worsening of overactive bladder.   ***Sling: The effectiveness of a midurethral vaginal mesh sling is approximately 85%, and thus, there will be times when you may leak urine after surgery, especially if your bladder is full or if you have a strong cough. There is a balance between making the sling tight enough to treat your leakage but not too tight so that you have long-term difficulty emptying your bladder. A mesh sling will not directly treat overactive bladder/urge incontinence and may worsen it.  There is an FDA safety notification on vaginal mesh procedures for prolapse but NOT mesh slings. We have extensive experience and training with mesh placement and we have close postoperative follow up to identify any potential complications from mesh. It is important to realize that this mesh is a permanent implant that cannot be easily removed. There are rare risks of mesh exposure (2-4%), pain with intercourse (0-7%), and infection (<1%). The risk of mesh exposure if more likely in a woman with risks for poor healing (prior radiation, poorly controlled diabetes, or immunocompromised). The risk of new or worsened chronic pain after mesh implant is more common in women with baseline chronic pain and/or poorly controlled anxiety or depression. Approximately 2-4% of patients will experience longer-term post-operative voiding dysfunction that may require surgical revision of the sling. We also reviewed that postoperatively, her stream may not be as strong as before surgery.   *** Prolapse (with or without mesh): Risk factors for surgical failure  include things that put pressure on your pelvis and the  surgical repair, including obesity, chronic cough, and heavy lifting or straining (including lifting children or adults, straining on the toilet, or lifting heavy objects such as furniture or anything weighing >25 lbs. Risks of recurrence is 20-30% with vaginal native tissue repair and a less than 10% with sacrocolpopexy with mesh.    ***Sacrocolpopexy: Mesh implants may provide more prolapse support, but do have some unique risks to consider. It is important to understand that mesh is permanent and cannot be easily removed. Risks of abdominal sacrocolpopexy mesh include mesh exposure (~3-6%), painful intercourse (recent studies show lower rates after surgery compared to before, with ~5-8% risk of new onset), and very rare risks of bowel or bladder injury or infection (<1%). The risk of mesh exposure is more likely in a woman with risks for poor healing (prior radiation, poorly controlled diabetes, or immunocompromised). The risk of new or worsened chronic pain after mesh implant is more common in women with baseline chronic pain and/or poorly controlled anxiety or depression. There is an FDA safety notification on vaginal mesh procedures for  prolapse but NOT abdominal mesh procedures and therefore does not apply to your surgery. We have extensive experience and training with mesh placement and we have close postoperative follow up to identify any potential complications from mesh.    We discussed consent for blood products. Risks for blood transfusion include allergic reactions, other reactions that can affect different body organs and managed accordingly, transmission of infectious diseases such as HIV or Hepatitis. However, the blood is screened. Patient consents for blood products.***  Pre-operative instructions:  She was instructed to not take Aspirin/NSAIDs x 7days prior to surgery. She may continue her 81mg  ASA.*** Antibiotic prophylaxis was ordered as indicated.  Catheter use: Patient will go home  with foley if needed after post-operative voiding trial.  Post-operative instructions:  She was provided with specific post-operative instructions, including precautions and signs/symptoms for which we would recommend contacting us, in addition to daytime and after-hours contact phone numbers. This was provided on a handout.   Post-operative medications: ***Prescriptions for motrin, tylenol, miralax, and oxycodone were sent to her pharmacy. Discussed using ibuprofen and tylenol on a schedule to limit use of narcotics.   Laboratory testing:  We will check labs: ***. Day of surgery UPT***  Preoperative clearance:  She {does/does not:19886} require surgical clearance.    Post-operative follow-up:  A post-operative appointment will be made for 6 weeks from the date of surgery. If she needs a post-operative nurse visit for a voiding trial, that will be set up after she leaves the hospital.    Patient will call the clinic or use MyChart should anything change or any new issues arise.   Loleta Chance, MD   ***OR orders

## 2024-03-10 ENCOUNTER — Ambulatory Visit (INDEPENDENT_AMBULATORY_CARE_PROVIDER_SITE_OTHER): Payer: BC Managed Care – PPO | Admitting: Obstetrics

## 2024-03-10 ENCOUNTER — Encounter: Payer: Self-pay | Admitting: Obstetrics

## 2024-03-10 ENCOUNTER — Encounter: Payer: BC Managed Care – PPO | Admitting: Physical Therapy

## 2024-03-10 VITALS — BP 121/69 | HR 84 | Wt 166.4 lb

## 2024-03-10 DIAGNOSIS — Z01818 Encounter for other preprocedural examination: Secondary | ICD-10-CM | POA: Diagnosis not present

## 2024-03-10 DIAGNOSIS — N952 Postmenopausal atrophic vaginitis: Secondary | ICD-10-CM

## 2024-03-10 DIAGNOSIS — N8110 Cystocele, unspecified: Secondary | ICD-10-CM | POA: Insufficient documentation

## 2024-03-10 DIAGNOSIS — N393 Stress incontinence (female) (male): Secondary | ICD-10-CM

## 2024-03-10 NOTE — Patient Instructions (Signed)
 You have a stage 2 (out of 4) prolapse.  We discussed the fact that it is not life threatening but there are several treatment options. For treatment of pelvic organ prolapse, we discussed options for management including expectant management, conservative management, and surgical management, such as Kegels, a pessary, pelvic floor physical therapy, and specific surgical procedures.     Sling: The effectiveness of a midurethral vaginal mesh sling is approximately 85%, and thus, there will be times when you may leak urine after surgery, especially if your bladder is full or if you have a strong cough. There is a balance between making the sling tight enough to treat your leakage but not too tight so that you have long-term difficulty emptying your bladder. A mesh sling will not directly treat overactive bladder/urge incontinence and may worsen it.  There is an FDA safety notification on vaginal mesh procedures for prolapse but NOT mesh slings. We have extensive experience and training with mesh placement and we have close postoperative follow up to identify any potential complications from mesh. It is important to realize that this mesh is a permanent implant that cannot be easily removed. There are rare risks of mesh exposure (2-4%), pain with intercourse (0-7%), and infection (<1%). The risk of mesh exposure if more likely in a woman with risks for poor healing (prior radiation, poorly controlled diabetes, or immunocompromised). The risk of new or worsened chronic pain after mesh implant is more common in women with baseline chronic pain and/or poorly controlled anxiety or depression. Approximately 2-4% of patients will experience longer-term post-operative voiding dysfunction that may require surgical revision of the sling. We also reviewed that postoperatively, her stream may not be as strong as before surgery.

## 2024-03-12 ENCOUNTER — Encounter: Payer: BC Managed Care – PPO | Admitting: Physical Therapy

## 2024-03-16 ENCOUNTER — Encounter: Payer: Self-pay | Admitting: Obstetrics

## 2024-03-17 ENCOUNTER — Encounter: Payer: BC Managed Care – PPO | Admitting: Physical Therapy

## 2024-03-19 ENCOUNTER — Encounter: Payer: BC Managed Care – PPO | Admitting: Physical Therapy

## 2024-04-02 ENCOUNTER — Ambulatory Visit: Payer: BC Managed Care – PPO | Admitting: Obstetrics and Gynecology

## 2024-04-27 ENCOUNTER — Other Ambulatory Visit: Payer: Self-pay | Admitting: Obstetrics and Gynecology

## 2024-04-27 ENCOUNTER — Encounter: Payer: Self-pay | Admitting: Obstetrics and Gynecology

## 2024-04-27 ENCOUNTER — Ambulatory Visit (INDEPENDENT_AMBULATORY_CARE_PROVIDER_SITE_OTHER): Admitting: Obstetrics and Gynecology

## 2024-04-27 VITALS — BP 147/80 | HR 82 | Wt 163.0 lb

## 2024-04-27 DIAGNOSIS — N393 Stress incontinence (female) (male): Secondary | ICD-10-CM

## 2024-04-27 DIAGNOSIS — N8110 Cystocele, unspecified: Secondary | ICD-10-CM

## 2024-04-27 DIAGNOSIS — N952 Postmenopausal atrophic vaginitis: Secondary | ICD-10-CM

## 2024-04-27 DIAGNOSIS — Z01818 Encounter for other preprocedural examination: Secondary | ICD-10-CM

## 2024-04-27 MED ORDER — OXYCODONE HCL 5 MG PO TABS: 5.0000 mg | ORAL_TABLET | ORAL | 0 refills | Status: DC | PRN

## 2024-04-27 MED ORDER — IBUPROFEN 600 MG PO TABS: 600.0000 mg | ORAL_TABLET | Freq: Four times a day (QID) | ORAL | 0 refills | Status: DC | PRN

## 2024-04-27 MED ORDER — POLYETHYLENE GLYCOL 3350 17 GM/SCOOP PO POWD: 17.0000 g | Freq: Every day | ORAL | 0 refills | Status: AC

## 2024-04-27 MED ORDER — ESTRADIOL 0.1 MG/GM VA CREA: TOPICAL_CREAM | VAGINAL | 3 refills | Status: DC

## 2024-04-27 MED ORDER — ACETAMINOPHEN 500 MG PO TABS: 500.0000 mg | ORAL_TABLET | Freq: Four times a day (QID) | ORAL | 0 refills | Status: DC | PRN

## 2024-04-27 NOTE — Progress Notes (Signed)
 [facility] Pre-Operative Exam  Subjective Chief Complaint: [name] [name] presents for a preoperative encounter.   History of Present Illness: [name] [name] is a 53 y.o. female who presents for preoperative visit.  She is scheduled to undergo Exam under anesthesia, Anterior colporhaphy for cystocele repair, midurethral sling, and cystoscopy on 05/22/24.  Her symptoms include pelvic organ prolapse and stress urinary incontinence, and she was was found to have Stage II anterior, Stage I posterior, Stage I apical prolapse.   CMG showed: CMG showed normal sensation, and normal cystometric capacity. Findings positive for stress incontinence, negative for detrusor overactivity.   Past Medical History:  Diagnosis Date   GERD (gastroesophageal reflux disease)    History of 2019 novel coronavirus disease (COVID-19) 04/10/2020   Motion sickness    roller coasters   Wears contact lenses      Past Surgical History:  Procedure Laterality Date   DILATION AND CURETTAGE OF UTERUS     ECTOPIC PREGNANCY SURGERY     ESOPHAGOGASTRODUODENOSCOPY (EGD) WITH PROPOFOL  N/A 02/21/2018   Procedure: ESOPHAGOGASTRODUODENOSCOPY (EGD) WITH PROPOFOL ;  Surgeon: Marnee Sink, MD;  Location: [facility];  Service: Endoscopy;  Laterality: N/A;   FOOT SURGERY Left    POLYPECTOMY  02/21/2018   Procedure: POLYPECTOMY;  Surgeon: Marnee Sink, MD;  Location: [facility];  Service: Endoscopy;;    is allergic to sulfa antibiotics.   Family History  Problem Relation Age of Onset   Hypertension Mother    Atrial fibrillation Mother    Healthy Father    Ovarian cancer Maternal Aunt    Breast cancer Neg Hx    Uterine cancer Neg Hx    Bladder Cancer Neg Hx     Social History   Tobacco Use   Smoking status: Never   Smokeless tobacco: Never  Vaping Use   Vaping status: Never Used  Substance Use Topics   Alcohol use: No   Drug use: No     Review of Systems was negative for a  full 10 system review except as noted in the History of Present Illness.   Current Outpatient Medications:    acetaminophen  (TYLENOL ) 500 MG tablet, Take 1 tablet (500 mg total) by mouth every 6 (six) hours as needed (pain)., Disp: 30 tablet, Rfl: 0   dexlansoprazole  (DEXILANT ) 60 MG capsule, Take 1 capsule (60 mg total) by mouth daily. **PLEASE SCHEDULE FOLLOW UP APPT**, Disp: 90 capsule, Rfl: 0   diclofenac  Sodium (VOLTAREN ) 1 % GEL, Apply 4 g topically 4 (four) times daily., Disp: 100 g, Rfl: 4   Homeopathic Products (ALLERGY MEDICINE PO), Take by mouth., Disp: , Rfl:    ibuprofen (ADVIL) 600 MG tablet, Take 1 tablet (600 mg total) by mouth every 6 (six) hours as needed., Disp: 30 tablet, Rfl: 0   levonorgestrel (MIRENA) 20 MCG/24HR IUD, by Intrauterine route., Disp: , Rfl:    MULTIPLE VITAMIN PO, Take by mouth., Disp: , Rfl:    oxyCODONE  (OXY IR/ROXICODONE ) 5 MG immediate release tablet, Take 1 tablet (5 mg total) by mouth every 4 (four) hours as needed for severe pain (pain score 7-10)., Disp: 15 tablet, Rfl: 0   polyethylene glycol powder (GLYCOLAX/MIRALAX) 17 GM/SCOOP powder, Take 17 g by mouth daily. Drink 17g (1 scoop) dissolved in water  per day., Disp: 255 g, Rfl: 0   Probiotic Product (PROBIOTIC DAILY PO), Take by mouth., Disp: , Rfl:    senna-docusate (SENOKOT-S) 8.6-50 MG tablet, Take 1 tablet by mouth daily., Disp: , Rfl:  estradiol  (ESTRACE ) 0.1 MG/GM vaginal cream, Place 0.5-1g nightly for two weeks then twice a week after, Disp: 43 g, Rfl: 3   Objective Vitals:   04/27/24 0836  BP: (!) 147/80  Pulse: 82    Gen: NAD CV: S1 S2 RRR Lungs: Clear to auscultation bilaterally Abd: soft, nontender   Previous Pelvic Exam showed: POP-Q   -1                                            Aa   -1                                           Ba   -7                                              C    3                                            Gh   2                                             Pb   8                                            tvl    -2                                            Ap   -2                                            Bp   -8                                              D      Assessment/ Plan  Assessment: The patient is a 53 y.o. year old scheduled to undergo Exam under anesthesia, Anterior colporhaphy for cystocele repair, midurethral sling, and cystoscopy. Verbal consent was obtained for these procedures.  Plan: General Surgical Consent: The patient has previously been counseled on alternative treatments, and the decision by the patient and provider was to proceed with the procedure listed above.  For all procedures, there are risks of bleeding, infection, damage to surrounding organs including but not limited to bowel, bladder, blood vessels, ureters and nerves, and need for further surgery if an injury were to occur. These risks are all  low with minimally invasive surgery.   There are risks of numbness and weakness at any body site or buttock/rectal pain.  It is possible that baseline pain can be worsened by surgery, either with or without mesh. If surgery is vaginal, there is also a low risk of possible conversion to laparoscopy or open abdominal incision where indicated. Very rare risks include blood transfusion, blood clot, heart attack, pneumonia, or death.   There is also a risk of short-term postoperative urinary retention with need to use a catheter. About half of patients need to go home from surgery with a catheter, which is then later removed in the office. The risk of long-term need for a catheter is very low. There is also a risk of worsening of overactive bladder.   Sling: The effectiveness of a midurethral vaginal mesh sling is approximately 85%, and thus, there will be times when you may leak urine after surgery, especially if your bladder is full or if you have a strong cough. There is a balance between making  the sling tight enough to treat your leakage but not too tight so that you have long-term difficulty emptying your bladder. A mesh sling will not directly treat overactive bladder/urge incontinence and may worsen it.  There is an FDA safety notification on vaginal mesh procedures for prolapse but NOT mesh slings. We have extensive experience and training with mesh placement and we have close postoperative follow up to identify any potential complications from mesh. It is important to realize that this mesh is a permanent implant that cannot be easily removed. There are rare risks of mesh exposure (2-4%), pain with intercourse (0-7%), and infection (<1%). The risk of mesh exposure if more likely in a woman with risks for poor healing (prior radiation, poorly controlled diabetes, or immunocompromised). The risk of new or worsened chronic pain after mesh implant is more common in women with baseline chronic pain and/or poorly controlled anxiety or depression. Approximately 2-4% of patients will experience longer-term post-operative voiding dysfunction that may require surgical revision of the sling. We also reviewed that postoperatively, her stream may not be as strong as before surgery.   Prolapse (with or without mesh): Risk factors for surgical failure  include things that put pressure on your pelvis and the surgical repair, including obesity, chronic cough, and heavy lifting or straining (including lifting children or adults, straining on the toilet, or lifting heavy objects such as furniture or anything weighing >25 lbs. Risks of recurrence is 20-30% with vaginal native tissue repair and a less than 10% with sacrocolpopexy with mesh.     We discussed consent for blood products. Risks for blood transfusion include allergic reactions, other reactions that can affect different body organs and managed accordingly, transmission of infectious diseases such as HIV or Hepatitis. However, the blood is screened.  Patient consents for blood products.  Pre-operative instructions:  She was instructed to not take Aspirin/NSAIDs x 7days prior to surgery. She may continue her 81mg  ASA. Antibiotic prophylaxis was ordered as indicated.  Catheter use: Patient will go home with foley if needed after post-operative voiding trial.  Post-operative instructions:  She was provided with specific post-operative instructions, including precautions and signs/symptoms for which we would recommend contacting us , in addition to daytime and after-hours contact phone numbers. This was provided on a handout.   Post-operative medications: Prescriptions for motrin, tylenol , miralax, and oxycodone  were sent to her pharmacy. Discussed using ibuprofen and tylenol  on a schedule to limit use of narcotics.  Laboratory testing:  We will check labs:As requested by Anesthesia  Preoperative clearance:  She does not require surgical clearance.    Post-operative follow-up:  A post-operative appointment will be made for 6 weeks from the date of surgery. If she needs a post-operative nurse visit for a voiding trial, that will be set up after she leaves the hospital.    Patient will call the clinic or use MyChart should anything change or any new issues arise.   Baylee Mccorkel G Mahathi Pokorney, NP

## 2024-04-27 NOTE — H&P (Signed)
 [facility] H&P  Subjective Chief Complaint: [name] [name] presents for a preoperative encounter.   History of Present Illness: [name] [name] is a 53 y.o. female who presents for preoperative visit.  She is scheduled to undergo Exam under anesthesia, Anterior colporhaphy for cystocele repair, midurethral sling, and cystoscopy on 05/22/24.  Her symptoms include pelvic organ prolapse and stress urinary incontinence, and she was was found to have Stage II anterior, Stage I posterior, Stage I apical prolapse.   CMG showed: CMG showed normal sensation, and normal cystometric capacity. Findings positive for stress incontinence, negative for detrusor overactivity.   Past Medical History:  Diagnosis Date   GERD (gastroesophageal reflux disease)    History of 2019 novel coronavirus disease (COVID-19) 04/10/2020   Motion sickness    roller coasters   Wears contact lenses      Past Surgical History:  Procedure Laterality Date   DILATION AND CURETTAGE OF UTERUS     ECTOPIC PREGNANCY SURGERY     ESOPHAGOGASTRODUODENOSCOPY (EGD) WITH PROPOFOL  N/A 02/21/2018   Procedure: ESOPHAGOGASTRODUODENOSCOPY (EGD) WITH PROPOFOL ;  Surgeon: Marnee Sink, MD;  Location: [facility];  Service: Endoscopy;  Laterality: N/A;   FOOT SURGERY Left    POLYPECTOMY  02/21/2018   Procedure: POLYPECTOMY;  Surgeon: Marnee Sink, MD;  Location: [facility];  Service: Endoscopy;;    is allergic to sulfa antibiotics.   Family History  Problem Relation Age of Onset   Hypertension Mother    Atrial fibrillation Mother    Healthy Father    Ovarian cancer Maternal Aunt    Breast cancer Neg Hx    Uterine cancer Neg Hx    Bladder Cancer Neg Hx     Social History   Tobacco Use   Smoking status: Never   Smokeless tobacco: Never  Vaping Use   Vaping status: Never Used  Substance Use Topics   Alcohol use: No   Drug use: No     Review of Systems was negative for a full 10 system  review except as noted in the History of Present Illness.  No current facility-administered medications for this encounter.  Current Outpatient Medications:    acetaminophen  (TYLENOL ) 500 MG tablet, Take 1 tablet (500 mg total) by mouth every 6 (six) hours as needed (pain)., Disp: 30 tablet, Rfl: 0   dexlansoprazole  (DEXILANT ) 60 MG capsule, Take 1 capsule (60 mg total) by mouth daily. **PLEASE SCHEDULE FOLLOW UP APPT**, Disp: 90 capsule, Rfl: 0   diclofenac  Sodium (VOLTAREN ) 1 % GEL, Apply 4 g topically 4 (four) times daily., Disp: 100 g, Rfl: 4   estradiol  (ESTRACE ) 0.1 MG/GM vaginal cream, Place 0.5-1g nightly for two weeks then twice a week after, Disp: 43 g, Rfl: 3   Homeopathic Products (ALLERGY MEDICINE PO), Take by mouth., Disp: , Rfl:    ibuprofen (ADVIL) 600 MG tablet, Take 1 tablet (600 mg total) by mouth every 6 (six) hours as needed., Disp: 30 tablet, Rfl: 0   levonorgestrel (MIRENA) 20 MCG/24HR IUD, by Intrauterine route., Disp: , Rfl:    MULTIPLE VITAMIN PO, Take by mouth., Disp: , Rfl:    oxyCODONE  (OXY IR/ROXICODONE ) 5 MG immediate release tablet, Take 1 tablet (5 mg total) by mouth every 4 (four) hours as needed for severe pain (pain score 7-10)., Disp: 15 tablet, Rfl: 0   polyethylene glycol powder (GLYCOLAX/MIRALAX) 17 GM/SCOOP powder, Take 17 g by mouth daily. Drink 17g (1 scoop) dissolved in water  per day., Disp: 255 g, Rfl: 0  Probiotic Product (PROBIOTIC DAILY PO), Take by mouth., Disp: , Rfl:    senna-docusate (SENOKOT-S) 8.6-50 MG tablet, Take 1 tablet by mouth daily., Disp: , Rfl:    Objective There were no vitals filed for this visit.   Gen: NAD CV: S1 S2 RRR Lungs: Clear to auscultation bilaterally Abd: soft, nontender   Previous Pelvic Exam showed: POP-Q   -1                                            Aa   -1                                           Ba   -7                                              C    3                                             Gh   2                                            Pb   8                                            tvl    -2                                            Ap   -2                                            Bp   -8                                              D      Assessment/ Plan  Assessment: The patient is a 53 y.o. year old scheduled to undergo Exam under anesthesia, Anterior colporhaphy for cystocele repair, midurethral sling, and cystoscopy. Verbal consent was obtained for these procedures.

## 2024-05-14 ENCOUNTER — Encounter (HOSPITAL_COMMUNITY): Payer: Self-pay | Admitting: Obstetrics

## 2024-05-14 NOTE — Progress Notes (Signed)
 Spoke w/ via phone for pre-op interview--- [name] [name] Lab needs dos----  NONE       Lab results------ COVID test -----patient states asymptomatic no test needed Arrive at -------0645 NPO after MN NO Solid Food.   Pre-Surgery Ensure or G2:  Med rec completed Medications to take morning of surgery -----NONE Diabetic medication -----  GLP1 agonist last dose: GLP1 instructions:  Patient instructed no nail polish to be worn day of surgery Patient instructed to bring photo id and insurance card day of surgery Patient aware to have Driver (ride ) / caregiver    for 24 hours after surgery - Husband [name] [name] Patient Special Instructions ----- Shower with antibacterial soap. Pre-Op special Instructions -----  Patient verbalized understanding of instructions that were given at this phone interview. Patient denies chest pain, sob, fever, cough at the interview.

## 2024-05-22 ENCOUNTER — Telehealth: Payer: Self-pay | Admitting: Obstetrics

## 2024-05-22 ENCOUNTER — Other Ambulatory Visit: Payer: Self-pay

## 2024-05-22 ENCOUNTER — Ambulatory Visit (HOSPITAL_COMMUNITY)

## 2024-05-22 ENCOUNTER — Encounter (HOSPITAL_COMMUNITY): Payer: Self-pay | Admitting: Obstetrics

## 2024-05-22 ENCOUNTER — Encounter (HOSPITAL_COMMUNITY)
Admission: RE | Disposition: A | Discharge status: Home / Self Care | Payer: Self-pay | Source: Home / Self Care | Attending: Obstetrics

## 2024-05-22 ENCOUNTER — Ambulatory Visit (HOSPITAL_COMMUNITY)
Admission: RE | Admit: 2024-05-22 | Discharge: 2024-05-22 | Disposition: A | Discharge status: Home / Self Care | Attending: Obstetrics | Admitting: Obstetrics

## 2024-05-22 DIAGNOSIS — N393 Stress incontinence (female) (male): Secondary | ICD-10-CM | POA: Diagnosis present

## 2024-05-22 DIAGNOSIS — Z01818 Encounter for other preprocedural examination: Secondary | ICD-10-CM

## 2024-05-22 DIAGNOSIS — N8110 Cystocele, unspecified: Secondary | ICD-10-CM | POA: Insufficient documentation

## 2024-05-22 HISTORY — PX: BLADDER SUSPENSION: SHX72

## 2024-05-22 HISTORY — PX: CYSTOSCOPY: SHX5120

## 2024-05-22 HISTORY — PX: CYSTOCELE REPAIR: SHX163

## 2024-05-22 LAB — POCT PREGNANCY, URINE: Preg Test, Ur: NEGATIVE

## 2024-05-22 SURGERY — COLPORRHAPHY, ANTERIOR, FOR CYSTOCELE REPAIR
Anesthesia: General | Site: Vagina

## 2024-05-22 MED ORDER — ACETAMINOPHEN 500 MG PO TABS
ORAL_TABLET | ORAL | Status: AC
  Filled 2024-05-22: qty 2

## 2024-05-22 MED ORDER — OXYCODONE HCL 5 MG/5ML PO SOLN: 5.0000 mg | Freq: Once | ORAL | Status: DC | PRN

## 2024-05-22 MED ORDER — SUGAMMADEX SODIUM 200 MG/2ML IV SOLN
INTRAVENOUS | Status: DC | PRN
  Administered 2024-05-22: 150 mg via INTRAVENOUS

## 2024-05-22 MED ORDER — ONDANSETRON HCL 4 MG/2ML IJ SOLN
INTRAMUSCULAR | Status: AC
  Filled 2024-05-22: qty 2

## 2024-05-22 MED ORDER — SODIUM CHLORIDE 0.9 % IV SOLN
INTRAVENOUS | Status: AC
  Filled 2024-05-22: qty 2

## 2024-05-22 MED ORDER — CHLORHEXIDINE GLUCONATE 0.12 % MT SOLN
OROMUCOSAL | Status: AC
  Filled 2024-05-22: qty 15

## 2024-05-22 MED ORDER — ACETAMINOPHEN 10 MG/ML IV SOLN: 1000.0000 mg | Freq: Once | INTRAVENOUS | Status: DC | PRN

## 2024-05-22 MED ORDER — KETOROLAC TROMETHAMINE 30 MG/ML IJ SOLN
INTRAMUSCULAR | Status: AC
Start: 2024-05-22 — End: ?
  Filled 2024-05-22: qty 1

## 2024-05-22 MED ORDER — PHENYLEPHRINE HCL-NACL 20-0.9 MG/250ML-% IV SOLN
INTRAVENOUS | Status: DC | PRN
  Administered 2024-05-22: 40 ug/min via INTRAVENOUS

## 2024-05-22 MED ORDER — ORAL CARE MOUTH RINSE: 15.0000 mL | Freq: Once | OROMUCOSAL | Status: AC

## 2024-05-22 MED ORDER — PHENYLEPHRINE 80 MCG/ML (10ML) SYRINGE FOR IV PUSH (FOR BLOOD PRESSURE SUPPORT)
PREFILLED_SYRINGE | INTRAVENOUS | Status: AC
  Filled 2024-05-22: qty 10

## 2024-05-22 MED ORDER — GABAPENTIN 300 MG PO CAPS
ORAL_CAPSULE | ORAL | Status: AC
  Filled 2024-05-22: qty 1

## 2024-05-22 MED ORDER — FENTANYL CITRATE (PF) 100 MCG/2ML IJ SOLN: 25.0000 ug | INTRAMUSCULAR | Status: DC | PRN

## 2024-05-22 MED ORDER — ROCURONIUM BROMIDE 10 MG/ML (PF) SYRINGE
PREFILLED_SYRINGE | INTRAVENOUS | Status: DC | PRN
  Administered 2024-05-22: 60 mg via INTRAVENOUS

## 2024-05-22 MED ORDER — GABAPENTIN 300 MG PO CAPS
300.0000 mg | ORAL_CAPSULE | ORAL | Status: AC
  Administered 2024-05-22: 300 mg via ORAL

## 2024-05-22 MED ORDER — ROCURONIUM BROMIDE 10 MG/ML (PF) SYRINGE
PREFILLED_SYRINGE | INTRAVENOUS | Status: AC
Start: 2024-05-22 — End: ?
  Filled 2024-05-22: qty 10

## 2024-05-22 MED ORDER — LIDOCAINE-EPINEPHRINE 1 %-1:100000 IJ SOLN
INTRAMUSCULAR | Status: AC
  Filled 2024-05-22: qty 2

## 2024-05-22 MED ORDER — MIDAZOLAM HCL 2 MG/2ML IJ SOLN
INTRAMUSCULAR | Status: AC
Start: 2024-05-22 — End: ?
  Filled 2024-05-22: qty 2

## 2024-05-22 MED ORDER — PROPOFOL 10 MG/ML IV BOLUS
INTRAVENOUS | Status: DC | PRN
  Administered 2024-05-22: 150 mg via INTRAVENOUS

## 2024-05-22 MED ORDER — LACTATED RINGERS IV SOLN: INTRAVENOUS | Status: DC

## 2024-05-22 MED ORDER — SODIUM CHLORIDE (PF) 0.9 % IJ SOLN
INTRAMUSCULAR | Status: AC
Start: 2024-05-22 — End: ?
  Filled 2024-05-22: qty 100

## 2024-05-22 MED ORDER — MIDAZOLAM HCL 2 MG/2ML IJ SOLN
INTRAMUSCULAR | Status: DC | PRN
  Administered 2024-05-22: 2 mg via INTRAVENOUS

## 2024-05-22 MED ORDER — LIDOCAINE-EPINEPHRINE 1 %-1:100000 IJ SOLN
INTRAMUSCULAR | Status: DC | PRN
  Administered 2024-05-22: 40 mL

## 2024-05-22 MED ORDER — KETOROLAC TROMETHAMINE 30 MG/ML IJ SOLN
INTRAMUSCULAR | Status: AC
  Filled 2024-05-22: qty 1

## 2024-05-22 MED ORDER — FENTANYL CITRATE (PF) 250 MCG/5ML IJ SOLN
INTRAMUSCULAR | Status: AC
  Filled 2024-05-22: qty 5

## 2024-05-22 MED ORDER — DROPERIDOL 2.5 MG/ML IJ SOLN: 0.6250 mg | Freq: Once | INTRAMUSCULAR | Status: DC | PRN

## 2024-05-22 MED ORDER — ONDANSETRON HCL 4 MG/2ML IJ SOLN
INTRAMUSCULAR | Status: DC | PRN
  Administered 2024-05-22: 4 mg via INTRAVENOUS

## 2024-05-22 MED ORDER — CHLORHEXIDINE GLUCONATE 0.12 % MT SOLN
15.0000 mL | Freq: Once | OROMUCOSAL | Status: AC
  Administered 2024-05-22: 15 mL via OROMUCOSAL

## 2024-05-22 MED ORDER — OXYCODONE HCL 5 MG PO TABS: 5.0000 mg | ORAL_TABLET | Freq: Once | ORAL | Status: DC | PRN

## 2024-05-22 MED ORDER — LIDOCAINE 2% (20 MG/ML) 5 ML SYRINGE
INTRAMUSCULAR | Status: DC | PRN
  Administered 2024-05-22: 60 mg via INTRAVENOUS

## 2024-05-22 MED ORDER — PHENAZOPYRIDINE HCL 100 MG PO TABS
200.0000 mg | ORAL_TABLET | ORAL | Status: AC
  Administered 2024-05-22: 200 mg via ORAL

## 2024-05-22 MED ORDER — ACETAMINOPHEN 500 MG PO TABS
1000.0000 mg | ORAL_TABLET | ORAL | Status: AC
  Administered 2024-05-22: 1000 mg via ORAL

## 2024-05-22 MED ORDER — KETOROLAC TROMETHAMINE 30 MG/ML IJ SOLN
INTRAMUSCULAR | Status: DC | PRN
  Administered 2024-05-22: 30 mg via INTRAVENOUS

## 2024-05-22 MED ORDER — DEXAMETHASONE SODIUM PHOSPHATE 10 MG/ML IJ SOLN
INTRAMUSCULAR | Status: AC
  Filled 2024-05-22: qty 1

## 2024-05-22 MED ORDER — SODIUM CHLORIDE 0.9 % IR SOLN
Status: DC | PRN
Start: 2024-05-22 — End: 2024-05-22
  Administered 2024-05-22: 1000 mL via INTRAVESICAL

## 2024-05-22 MED ORDER — FENTANYL CITRATE (PF) 250 MCG/5ML IJ SOLN
INTRAMUSCULAR | Status: DC | PRN
  Administered 2024-05-22: 100 ug via INTRAVENOUS
  Administered 2024-05-22 (×2): 50 ug via INTRAVENOUS

## 2024-05-22 MED ORDER — SODIUM CHLORIDE 0.9 % IV SOLN
2.0000 g | INTRAVENOUS | Status: AC
  Administered 2024-05-22: 2 g via INTRAVENOUS

## 2024-05-22 MED ORDER — STERILE WATER FOR IRRIGATION IR SOLN
Status: DC | PRN
  Administered 2024-05-22: 1000 mL

## 2024-05-22 MED ORDER — DEXAMETHASONE SODIUM PHOSPHATE 10 MG/ML IJ SOLN
INTRAMUSCULAR | Status: DC | PRN
  Administered 2024-05-22: 10 mg via INTRAVENOUS

## 2024-05-22 MED ORDER — PHENAZOPYRIDINE HCL 100 MG PO TABS
ORAL_TABLET | ORAL | Status: AC
  Filled 2024-05-22: qty 2

## 2024-05-22 MED ORDER — PHENYLEPHRINE 80 MCG/ML (10ML) SYRINGE FOR IV PUSH (FOR BLOOD PRESSURE SUPPORT)
PREFILLED_SYRINGE | INTRAVENOUS | Status: DC | PRN
  Administered 2024-05-22: 80 ug via INTRAVENOUS
  Administered 2024-05-22: 160 ug via INTRAVENOUS
  Administered 2024-05-22: 80 ug via INTRAVENOUS
  Administered 2024-05-22: 160 ug via INTRAVENOUS
  Administered 2024-05-22: 80 ug via INTRAVENOUS
  Administered 2024-05-22: 160 ug via INTRAVENOUS

## 2024-05-22 MED ORDER — LIDOCAINE 2% (20 MG/ML) 5 ML SYRINGE
INTRAMUSCULAR | Status: AC
  Filled 2024-05-22: qty 5

## 2024-05-22 MED ORDER — PROPOFOL 10 MG/ML IV BOLUS
INTRAVENOUS | Status: AC
Start: 2024-05-22 — End: ?
  Filled 2024-05-22: qty 20

## 2024-05-22 SURGICAL SUPPLY — 37 items
BAG URO CATCHER STRL LF (MISCELLANEOUS) ×3 IMPLANT
BLADE CLIPPER SENSICLIP SURGIC (BLADE) ×3 IMPLANT
BLADE SURG 15 STRL LF DISP TIS (BLADE) ×3 IMPLANT
CANISTER SUCTION 3000ML PPV (SUCTIONS) IMPLANT
DERMABOND ADVANCED .7 DNX12 (GAUZE/BANDAGES/DRESSINGS) ×3 IMPLANT
ELECTRODE REM PT RTRN 9FT ADLT (ELECTROSURGICAL) IMPLANT
GAUZE 4X4 16PLY ~~LOC~~+RFID DBL (SPONGE) IMPLANT
GLOVE BIOGEL PI IND STRL 6 (GLOVE) ×3 IMPLANT
GLOVE SS PI 5.5 STRL (GLOVE) ×6 IMPLANT
GOWN STRL REUS W/ TWL LRG LVL3 (GOWN DISPOSABLE) ×3 IMPLANT
HIBICLENS CHG 4% 4OZ BTL (MISCELLANEOUS) ×3 IMPLANT
HOLDER FOLEY CATH W/STRAP (MISCELLANEOUS) ×3 IMPLANT
KIT TURNOVER KIT B (KITS) ×3 IMPLANT
MANIFOLD NEPTUNE II (INSTRUMENTS) ×3 IMPLANT
NDL HYPO 22X1.5 SAFETY MO (MISCELLANEOUS) ×3 IMPLANT
NEEDLE HYPO 22X1.5 SAFETY MO (MISCELLANEOUS) ×3 IMPLANT
NS IRRIG 1000ML POUR BTL (IV SOLUTION) ×3 IMPLANT
PACK CYSTO (CUSTOM PROCEDURE TRAY) ×3 IMPLANT
PACK VAGINAL WOMENS (CUSTOM PROCEDURE TRAY) ×3 IMPLANT
RETRACTOR LONE STAR DISPOSABLE (INSTRUMENTS) ×3 IMPLANT
RETRACTOR STAY HOOK 5MM (MISCELLANEOUS) ×3 IMPLANT
SET CYSTO W/LG BORE CLAMP LF (SET/KITS/TRAYS/PACK) ×3 IMPLANT
SET IRRIG Y TYPE TUR BLADDER L (SET/KITS/TRAYS/PACK) ×3 IMPLANT
SLEEVE SCD COMPRESS KNEE MED (STOCKING) ×3 IMPLANT
SLING ADVANTAGE FIT TRANVAG (Sling) IMPLANT
SPIKE FLUID TRANSFER (MISCELLANEOUS) ×3 IMPLANT
SUCTION TUBE FRAZIER 10FR DISP (SUCTIONS) ×3 IMPLANT
SURGIFLO W/THROMBIN 8M KIT (HEMOSTASIS) IMPLANT
SUT ABS MONO DBL WITH NDL 48IN (SUTURE) IMPLANT
SUT PDS AB 2-0 CT2 27 (SUTURE) ×3 IMPLANT
SUT VIC AB 2-0 SH 27XBRD (SUTURE) ×3 IMPLANT
SUT VIC AB 3-0 SH 18 (SUTURE) IMPLANT
SUT VICRYL 2-0 SH 8X27 (SUTURE) IMPLANT
SYR BULB EAR ULCER 3OZ GRN STR (SYRINGE) ×3 IMPLANT
TOWEL GREEN STERILE FF (TOWEL DISPOSABLE) ×3 IMPLANT
TRAY FOLEY W/BAG SLVR 14FR (SET/KITS/TRAYS/PACK) ×3 IMPLANT
WATER STERILE IRR 1000ML POUR (IV SOLUTION) IMPLANT

## 2024-05-22 NOTE — Discharge Instructions (Addendum)
 POST OPERATIVE INSTRUCTIONS  General Instructions Recovery (not bed rest) will last approximately 6 weeks Walking is encouraged, but refrain from strenuous exercise/ housework/ heavy lifting. No lifting >10lbs  Nothing in the vagina- NO intercourse, tampons or douching Bathing:  Do not submerge in water  (NO swimming, bath, hot tub, etc) until after your postop visit. You can shower starting the day after surgery.  No driving until you are not taking narcotic pain medicine and until your pain is well enough controlled that you can slam on the breaks or make sudden movements if needed.   Taking your medications Please take your acetaminophen  and ibuprofen  on a schedule for the first 48 hours. Take 600mg  ibuprofen , then take 500mg  acetaminophen  3 hours later, then continue to alternate ibuprofen  and acetaminophen . That way you are taking each type of medication every 6 hours. Take the prescribed narcotic (oxycodone , tramadol, etc) as needed, with a maximum being every 4 hours.  Take a stool softener daily to keep your stools soft and preventing you from straining. If you have diarrhea, you decrease your stool softener. This is explained more below. We have prescribed you Miralax .  Reasons to Call the Nurse (see last page for phone numbers) Heavy Bleeding (changing your pad every 1-2 hours) Persistent nausea/vomiting Fever (100.4 degrees or more) Incision problems (pus or other fluid coming out, redness, warmth, increased pain)  Things to Expect After Surgery Mild to Moderate pain is normal during the first day or two after surgery. If prescribed, take Ibuprofen  or Tylenol  first and use the stronger medicine for "break-through" pain. You can overlap these medicines because they work differently.   Constipation   To Prevent Constipation:  Eat a well-balanced diet including protein, grains, fresh fruit and vegetables.  Drink plenty of fluids. Walk regularly.  Depending on specific instructions  from your physician: take Miralax  daily and additionally you can add a stool softener (colace/ docusate) and fiber supplement. Continue as long as you're on pain medications.   To Treat Constipation:  If you do not have a bowel movement in 2 days after surgery, you can take 2 Tbs of Milk of Magnesia 1-2 times a day until you have a bowel movement. If diarrhea occurs, decrease the amount or stop the laxative. If no results with Milk of Magnesia, you can drink a bottle of magnesium citrate which you can purchase over the counter.  Fatigue:  This is a normal response to surgery and will improve with time.  Plan frequent rest periods throughout the day.  Gas Pain:  This is very common but can also be very painful! Drink warm liquids such as herbal teas, bouillon or soup. Walking will help you pass more gas.  Mylicon or Gas-X can be taken over the counter.  Leaking Urine:  Varying amounts of leakage may occur after surgery.  This should improve with time. Your bladder needs at least 3 months to recover from surgery. If you leak after surgery, be sure to mention this to your doctor at your post-op visit. If you were taking medications for overactive bladder prior to surgery, be sure to restart the medications immediately after surgery.  Incisions: If you have incisions on your abdomen, the skin glue will dissolve on its own over time. It is ok to gently rinse with soap and water  over these incisions but do not scrub.  Catheter Approximately 50% of patients are unable to urinate after surgery and need to go home with a catheter. This allows your bladder to  rest so it can return to full function. If you go home with a catheter, the office will call to set up a voiding trial a few days after surgery. For most patients, by this visit, they are able to urinate on their own. Long term catheter use is rare.   Return to Work  As work demands and recovery times vary widely, it is hard to predict when you will want  to return to work. If you have a desk job with no strenuous physical activity, and if you would like to return sooner than generally recommended, discuss this with your provider or call our office.   Post op concerns  For non-emergent issues, please call the Urogynecology Nurse. Please leave a message and someone will contact you within one business day.  You can also send a message through MyChart.   AFTER HOURS (After 5:00 PM and on weekends):  For urgent matters that cannot wait until the next business day. Call our office [phone] and connect to the doctor on call.  Please reserve this for important issues.   **FOR ANY TRUE EMERGENCY ISSUES CALL 911 OR GO TO THE NEAREST EMERGENCY ROOM.** Please inform our office or the doctor on call of any emergency.     APPOINTMENTS: Call [phone]         No acetaminophen /Tylenol  until after 1:45 pm today if needed.  No ibuprofen , Advil , Aleve, Motrin , ketorolac , meloxicam , naproxen, or other NSAIDS until after 4:00 pm today if needed.     Post Anesthesia Home Care Instructions  Activity: Get plenty of rest for the remainder of the day. A responsible individual must stay with you for 24 hours following the procedure.  For the next 24 hours, DO NOT: -Drive a car -Advertising copywriter -Drink alcoholic beverages -Take any medication unless instructed by your physician -Make any legal decisions or sign important papers.  Meals: Start with liquid foods such as gelatin or soup. Progress to regular foods as tolerated. Avoid greasy, spicy, heavy foods. If nausea and/or vomiting occur, drink only clear liquids until the nausea and/or vomiting subsides. Call your physician if vomiting continues.  Special Instructions/Symptoms: Your throat may feel dry or sore from the anesthesia or the breathing tube placed in your throat during surgery. If this causes discomfort, gargle with warm salt water . The discomfort should disappear within 24  hours.

## 2024-05-22 NOTE — Interval H&P Note (Signed)
 History and Physical Interval Note:  05/22/2024 8:42 AM  [name] [name]  has presented today for surgery, with the diagnosis of anterior vaginal prolapse; stress urinary incontinence.  The various methods of treatment have been discussed with the patient and family. After consideration of risks, benefits and other options for treatment, the patient has consented to  Procedure(s) with comments: COLPORRHAPHY, ANTERIOR, FOR CYSTOCELE REPAIR (N/A) CREATION, URETHRAL SLING, RETROPUBIC APPROACH, USING POLYPROPYLENE TAPE (N/A) - midurethral sling CYSTOSCOPY (N/A) as a surgical intervention.  The patient's history has been reviewed, patient examined, no change in status, stable for surgery.  I have reviewed the patient's chart and labs.  Questions were answered to the patient's satisfaction.     Dione Petron Edgardo Goodwill

## 2024-05-22 NOTE — Transfer of Care (Signed)
 Immediate Anesthesia Transfer of Care Note  Patient: [name] [name]  Procedure(s) Performed: COLPORRHAPHY, ANTERIOR, FOR CYSTOCELE REPAIR (Vagina ) CREATION, URETHRAL SLING, RETROPUBIC APPROACH, USING POLYPROPYLENE TAPE (Pelvis) CYSTOSCOPY (Bladder)  Patient Location: PACU  Anesthesia Type:General  Level of Consciousness: awake, oriented, drowsy, patient cooperative, and responds to stimulation  Airway & Oxygen Therapy: Patient Spontanous Breathing  Post-op Assessment: Report given to RN and Post -op Vital signs reviewed and stable  Post vital signs: Reviewed and stable  Last Vitals:  Vitals Value Taken Time  BP 121/55 05/22/24 1027  Temp    Pulse 110 05/22/24 1029  Resp 16 05/22/24 1029  SpO2 92 % 05/22/24 1029  Vitals shown include unfiled device data.  Last Pain:  Vitals:   05/22/24 0730  TempSrc: Oral  PainSc: 0-No pain      Patients Stated Pain Goal: 5 (05/22/24 0730)  Complications: No notable events documented.

## 2024-05-22 NOTE — Op Note (Addendum)
 Operative Note  Preoperative Diagnosis: stress urinary incontinence, Stage II pelvic organ prolapse  Postoperative Diagnosis: stress urinary incontinence, Stage II pelvic organ prolapse  Procedures performed:  Retropubic midurethral sling, anterior repair, cystourethroscopy  Implants:  Implant Name Type Inv. Item Serial No. Manufacturer Lot No. LRB No. Used Action  Erick Hausen [facility] - [id number] Sling SLING ADVANTAGE FIT [facility]  [employer] [id number] N/A 1 Implanted    Attending Surgeon: Malinda Scurry, MD  Assistant Surgeon: n/a  Assistant: n/a  Anesthesia: General endotracheal  Findings: 1. On vaginal exam, stage II anterior vaginal wall prolapse present  2. On cystoscopy, normal bladder and urethral mucosa without injury or lesion. Brisk bilateral ureteral efflux present.    Specimens: * No specimens in log *  Estimated blood loss: 50 mL  IV fluids: 1400 mL  Urine output: 400 mL  Complications: none  Procedure in Detail: The patient was taken to the operating room where she was placed under anesthesia.  She was then placed in the dorsal lithotomy position in Sully stirrups, taking care to avoid traction on her extremities. She was then and prepped and draped in the usual sterile fashion.   A foley catheter was placed.  A lonestar retractor was placed with 4 stay hooks. Two Allis clamps were placed at along the anterior vaginal wall defect. 1% lidocaine  with epinephrine was injected into the vaginal mucosa. A vertical incision was made between the two Allis clamps with a 15 blade scalpel.  Metzenbaum scissors were used to undermine the vaginal mucosa from the underlying vesicovaginal septum. Plication of the vesicovaginal septum was then performed using 2-0 PDS. Excess vaginal epithelium was trimmed and the incision reapproximated with 2-0 Vicryl in a running fashion. The Foley catheter was removed.  A 70-degree cystoscope was introduced, and 360-degree  inspection revealed no trauma or trocars in the bladder, with bilateral ureteral efflux.  The bladder was drained and the cystoscope was removed.  The Foley catheter was reinserted.    The retropubic midurethral sling was performed next. Two Allis clamps were placed lateral to the location of the midurethra. 1% lidocaine  with epinephrine was injected into the vaginal mucosa and laterally, along with the suprapubic region at the trocar exit sites.  Using a 15 blade scapel a vertical incision was made in the vaginal mucosa. Metzenbaum scissors were used to dissect the vaginal mucosa off of the underlying muscularis and to create the periurethral tunnels. The trocar and attached sling were introduced into the right side of the vaginal incision, just inferior to the pubic symphysis on the right side. The trocar was guided through the endopelvic fascia and directly behind the pubic symphysis through the retropubic space, vertically.  The trocar was guided out through the suprapubic region, 1 fingerbreadths lateral to midline at the level of the pubic symphysis on the ipsilateral side. The trocar was similarly placed on the left side.  The Foley catheter was removed.  A 70-degree cystoscope was introduced, and 360-degree inspection revealed no trauma or trocars in the bladder, with bilateral ureteral efflux.  The cystoscope was removed and no foreign material was noted in the urethra.  The sling was brought to lie beneath the mid-urethra.  A Babcock clamp was placed to maintain a loop of the sling to ensure a tension free placement. The plastic sheath was removed from the sling and the distal ends of the sling were trimmed just below the level of the skin incisions. Hemostasis was noted.  Tension-free positioning  of the sling was confirmed and a Crede maneuver was performed with no leakage noted at the urethral meatus.  The Foley catheter was reinserted.  The vaginal incision was closed with 2-0 vicryl. The skin  incisions were closed with dermabond.    The vagina was irrigated.  Hemostasis was noted. The patient tolerated the procedure well and was taken to the recovery room in stable condition. Needle and sponge count was correct x2.

## 2024-05-22 NOTE — Anesthesia Postprocedure Evaluation (Signed)
 Anesthesia Post Note  Patient: [name] [name]  Procedure(s) Performed: COLPORRHAPHY, ANTERIOR, FOR CYSTOCELE REPAIR (Vagina ) CREATION, URETHRAL SLING, RETROPUBIC APPROACH, USING POLYPROPYLENE TAPE (Pelvis) CYSTOSCOPY (Bladder)     Patient location during evaluation: PACU Anesthesia Type: General Level of consciousness: awake and alert Pain management: pain level controlled Vital Signs Assessment: post-procedure vital signs reviewed and stable Respiratory status: spontaneous breathing, nonlabored ventilation, respiratory function stable and patient connected to nasal cannula oxygen Cardiovascular status: blood pressure returned to baseline and stable Postop Assessment: no apparent nausea or vomiting Anesthetic complications: no   No notable events documented.  Last Vitals:  Vitals:   05/22/24 1145 05/22/24 1200  BP: 130/80   Pulse: 90   Resp: 18 15  Temp:    SpO2: 95% 95%    Last Pain:  Vitals:   05/22/24 1100  TempSrc:   PainSc: 0-No pain                 Lethaniel Rave

## 2024-05-22 NOTE — Anesthesia Preprocedure Evaluation (Addendum)
 Anesthesia Evaluation  Patient identified by MRN, date of birth, ID band Patient awake    Reviewed: Allergy & Precautions, H&P , NPO status , Patient's Chart, lab work & pertinent test results, reviewed documented beta blocker date and time   Airway Mallampati: II  TM Distance: >3 FB Neck ROM: full    Dental no notable dental hx.    Pulmonary neg pulmonary ROS, neg COPD   Pulmonary exam normal breath sounds clear to auscultation       Cardiovascular Exercise Tolerance: Good (-) hypertension(-) Past MI negative cardio ROS Normal cardiovascular exam Rhythm:regular Rate:Normal     Neuro/Psych negative neurological ROS  negative psych ROS   GI/Hepatic Neg liver ROS, hiatal hernia,GERD  ,,  Endo/Other  negative endocrine ROS    Renal/GU negative Renal ROS    Vaginal prolapse    Musculoskeletal   Abdominal   Peds  Hematology negative hematology ROS (+)   Anesthesia Other Findings   Reproductive/Obstetrics negative OB ROS                              Anesthesia Physical Anesthesia Plan  ASA: 2  Anesthesia Plan: General   Post-op Pain Management: Tylenol  PO (pre-op)*   Induction:   PONV Risk Score and Plan: 3 and Ondansetron, Dexamethasone and Midazolam  Airway Management Planned: Oral ETT  Additional Equipment: None  Intra-op Plan:   Post-operative Plan: Extubation in OR  Informed Consent: I have reviewed the patients History and Physical, chart, labs and discussed the procedure including the risks, benefits and alternatives for the proposed anesthesia with the patient or authorized representative who has indicated his/her understanding and acceptance.     Dental Advisory Given  Plan Discussed with: CRNA  Anesthesia Plan Comments:          Anesthesia Quick Evaluation

## 2024-05-22 NOTE — Anesthesia Procedure Notes (Signed)
 Procedure Name: Intubation Date/Time: 05/22/2024 8:59 AM  Performed by: Ballard Levels, CRNAPre-anesthesia Checklist: Patient identified, Emergency Drugs available, Suction available and Patient being monitored Patient Re-evaluated:Patient Re-evaluated prior to induction Oxygen Delivery Method: Circle System Utilized Preoxygenation: Pre-oxygenation with 100% oxygen Induction Type: IV induction Ventilation: Mask ventilation without difficulty Laryngoscope Size: Miller and 3 Grade View: Grade II Tube type: Oral Tube size: 7.0 mm Number of attempts: 1 Airway Equipment and Method: Stylet Placement Confirmation: ETT inserted through vocal cords under direct vision, positive ETCO2 and breath sounds checked- equal and bilateral Secured at: 20 cm Tube secured with: Tape Dental Injury: Teeth and Oropharynx as per pre-operative assessment

## 2024-05-25 ENCOUNTER — Encounter (HOSPITAL_COMMUNITY): Payer: Self-pay | Admitting: Obstetrics

## 2024-06-11 NOTE — Telephone Encounter (Signed)
 Patient reports doing well.  Denies UTI symptoms, dysuria, sensation of incomplete emptying, or blood in urine.  Reports regular bowel movements without difficulty.  Pending appt 07/06/24.

## 2024-07-06 ENCOUNTER — Ambulatory Visit (INDEPENDENT_AMBULATORY_CARE_PROVIDER_SITE_OTHER): Admitting: Obstetrics

## 2024-07-06 ENCOUNTER — Encounter: Payer: Self-pay | Admitting: Obstetrics

## 2024-07-06 VITALS — BP 154/80 | HR 76

## 2024-07-06 DIAGNOSIS — Z09 Encounter for follow-up examination after completed treatment for conditions other than malignant neoplasm: Secondary | ICD-10-CM

## 2024-07-06 DIAGNOSIS — Z48816 Encounter for surgical aftercare following surgery on the genitourinary system: Secondary | ICD-10-CM

## 2024-07-06 NOTE — Progress Notes (Signed)
 [facility]  Date of Visit: 07/06/2024  History of Present Illness: Ms. Gin is a 53 y.o. female scheduled today for a post-operative visit.   Surgery: s/p Retropubic midurethral sling, anterior repair, cystourethroscopy on 05/22/24  She passed her postoperative void trial.   Postoperative course was uncomplicated.   Today she reports doing well.   UTI in the last 6 weeks? No  Pain? No  She has returned to her normal activity (except for postop restrictions) Vaginal bulge? No  Stress incontinence: No  Urgency/frequency: No Voids 4-6, voids 1-2x/night when she is already up at night Stopped drinking fluids around bedtime Urge incontinence: No  Voiding dysfunction: No  Bowel issues: No   Subjective Success: Do you usually have a bulge or something falling out that you can see or feel in the vaginal area? No  Retreatment Success: Any retreatment with surgery or pessary for any compartment? No   Medications: She has a current medication list which includes the following prescription(s): cholecalciferol, dexilant , diclofenac  sodium, homeopathic products, levonorgestrel, multiple vitamin, polyethylene glycol powder, probiotic product, senna-docusate, zinc gluconate, acetaminophen , ibuprofen , oxycodone , and [DISCONTINUED] pantoprazole .   Allergies: Patient is allergic to sulfa antibiotics.   Physical Exam: BP (!) 154/80   Pulse 76   Abdomen: soft, non-tender, without masses or organomegaly Suprapubic Incisions: healing well, no significant drainage, no dehiscence.  Pelvic Examination: Vagina: Incisions healing well, no significant drainage, no dehiscence, no significant erythema. Sutures are present at apex of the anterior vaginal wall incision line proximal to cervix and there is not granulation tissue. No tenderness along the anterior or posterior vagina. No apical tenderness. No pelvic masses. No visible or palpable mesh. IUD string visualized. Negative  CST  POP-Q: POP-Q  -3                                            Aa   -3                                           Ba  -8                                              C   2                                            Gh  3                                            Pb  9                                            tvl   -2  Ap  -2                                            Bp  -9                                              D    ---------------------------------------------------------  Assessment and Plan:  1. Postop check    - Can resume regular activity including exercise and intercourse,  if desired.  Reviewed exam finding of suture knot at the anterior vaginal wall, offered suture removal vs. Expectant mgmt. Pt declines removal at this time and reassess at follow-up. - Discussed avoidance of heavy lifting and straining long term to reduce the risk of recurrence.   All questions answered.   Return in about 6 weeks (around 08/17/2024).

## 2024-07-06 NOTE — Patient Instructions (Signed)
 Continue to monitor your urinary and vaginal symptoms.

## 2024-08-13 ENCOUNTER — Encounter: Payer: Self-pay | Admitting: Obstetrics

## 2024-08-13 ENCOUNTER — Ambulatory Visit: Admitting: Obstetrics

## 2024-08-13 VITALS — BP 135/81 | HR 73

## 2024-08-13 DIAGNOSIS — Z48816 Encounter for surgical aftercare following surgery on the genitourinary system: Secondary | ICD-10-CM

## 2024-08-13 DIAGNOSIS — N393 Stress incontinence (female) (male): Secondary | ICD-10-CM

## 2024-08-13 NOTE — Patient Instructions (Signed)
 For treatment of stress urinary incontinence, which is leakage with physical activity/movement/strainging/coughing, we discussed expectant management versus nonsurgical options versus surgery. Nonsurgical options include weight loss, physical therapy, as well as a pessary.  Surgical options include a midurethral sling, which is a synthetic mesh sling that acts like a hammock under the urethra to prevent leakage of urine, a Burch urethropexy, and transurethral injection of a bulking agent.   Please return for a follow-up if you experience any change in urinary or vaginal symptoms.

## 2024-08-13 NOTE — Assessment & Plan Note (Signed)
-   POCT UA + heme, 03/13/24 negative UA micro. PVR 8mL - s/p TVT advantage fit sling 05/22/24, negative CST on exam - reports 1 episodes of SUI since surgery, denies bother due to significant improvement  - For treatment of stress urinary incontinence,  reviewed non-surgical options include expectant management, weight loss, physical therapy, as well as a pessary.  Surgical options include a midurethral sling, Burch urethropexy, and transurethral injection of a bulking agent. - encouraged to continue vaginal estrogen  - discussed office procedure with urethral bulking (Bulkamid) if it becomes bothersome or worsens. We discussed success rate of approximately 70-80% and possible need for second injection. We reviewed that this is not a permanent procedure and the Bulkamid does dissolve over time. Risks reviewed including injury to bladder or urethra, UTI, urinary retention and hematuria.  - encouraged fluid management, Kegel exercises

## 2024-08-13 NOTE — Progress Notes (Signed)
 [facility]  Date of Visit: 08/13/2024  History of Present Illness: [name] is a 53 y.o. female scheduled today for a 3 month post-operative visit.   Surgery: s/p Retropubic midurethral sling, anterior repair, cystourethroscopy on 05/22/24  She passed her postoperative void trial.   Postoperative course was uncomplicated.   Today she reports doing well and resumed intercourse without difficulty  UTI in the last 6 weeks? No  Pain? No  She has returned to her normal activity (except for postop restrictions) Vaginal bulge? No  Stress incontinence: reports 1 episode of leakage since surgery, denies bother Urgency/frequency: No Voids 4-6, voids 1-2x/night when she is already up at night Stopped drinking fluids around bedtime Urge incontinence: No  Voiding dysfunction: No  Bowel issues: No   Subjective Success: Do you usually have a bulge or something falling out that you can see or feel in the vaginal area? No  Retreatment Success: Any retreatment with surgery or pessary for any compartment? No   Medications: She has a current medication list which includes the following prescription(s): dexilant , diclofenac  sodium, homeopathic products, levonorgestrel, multiple vitamin, probiotic product, senna-docusate, zinc gluconate, cholecalciferol, polyethylene glycol powder, and [DISCONTINUED] pantoprazole .   Allergies: Patient is allergic to sulfa antibiotics.   Physical Exam: BP 135/81 (BP Location: Left Arm, Patient Position: Sitting, Cuff Size: Normal)   Pulse 73   Abdomen: soft, non-tender, without masses or organomegaly Suprapubic Incisions: healing well, no significant drainage, no dehiscence.  Pelvic Examination: Vagina: Incisions healing well, no significant drainage, no dehiscence, no significant erythema. Sutures are present at apex of the anterior vaginal wall incision line proximal to cervix and there is not granulation tissue. No tenderness along the anterior or  posterior vagina. No apical tenderness. No pelvic masses. No visible or palpable mesh. IUD string visualized. Negative CST  POP-Q: POP-Q  -3                                            Aa   -3                                           Ba  -7                                              C   3                                            Gh  3                                            Pb  9                                            tvl   -2  Ap  -2                                            Bp  -8                                              D    ---------------------------------------------------------  Assessment and Plan:  1. SUI (stress urinary incontinence, female)    SUI (stress urinary incontinence, female) Assessment & Plan: - POCT UA + heme, 03/13/24 negative UA micro. PVR 8mL - s/p TVT advantage fit sling 05/22/24, negative CST on exam - reports 1 episodes of SUI since surgery, denies bother due to significant improvement  - For treatment of stress urinary incontinence,  reviewed non-surgical options include expectant management, weight loss, physical therapy, as well as a pessary.  Surgical options include a midurethral sling, Burch urethropexy, and transurethral injection of a bulking agent. - encouraged to continue vaginal estrogen  - discussed office procedure with urethral bulking (Bulkamid) if it becomes bothersome or worsens. We discussed success rate of approximately 70-80% and possible need for second injection. We reviewed that this is not a permanent procedure and the Bulkamid does dissolve over time. Risks reviewed including injury to bladder or urethra, UTI, urinary retention and hematuria.  - encouraged fluid management, Kegel exercises   - Can resume regular activity including exercise and intercourse.  - Discussed avoidance of heavy lifting and straining long term to reduce the risk of recurrence.   All  questions answered.   Return in about 1 year (around 08/13/2025).

## 2024-09-16 ENCOUNTER — Other Ambulatory Visit: Payer: Self-pay | Admitting: Family Medicine

## 2024-09-16 DIAGNOSIS — Z1231 Encounter for screening mammogram for malignant neoplasm of breast: Secondary | ICD-10-CM

## 2024-10-07 ENCOUNTER — Ambulatory Visit
Admission: RE | Admit: 2024-10-07 | Discharge: 2024-10-07 | Disposition: A | Discharge status: Home / Self Care | Source: Ambulatory Visit | Attending: Family Medicine | Admitting: Family Medicine

## 2024-10-07 DIAGNOSIS — Z1231 Encounter for screening mammogram for malignant neoplasm of breast: Secondary | ICD-10-CM | POA: Diagnosis present

## 2025-01-04 ENCOUNTER — Encounter: Payer: Self-pay | Admitting: *Deleted
